# Patient Record
Sex: Male | Born: 1975 | Race: White | Hispanic: No | Marital: Single | Smoking: Current every day smoker
Health system: Southern US, Community
[De-identification: ages and names within clinical notes are randomized; demographics above are authoritative.]

## PROBLEM LIST (undated history)

## (undated) DIAGNOSIS — H9190 Unspecified hearing loss, unspecified ear: Secondary | ICD-10-CM

## (undated) DIAGNOSIS — I82409 Acute embolism and thrombosis of unspecified deep veins of unspecified lower extremity: Secondary | ICD-10-CM

## (undated) DIAGNOSIS — I10 Essential (primary) hypertension: Secondary | ICD-10-CM

## (undated) DIAGNOSIS — Z720 Tobacco use: Secondary | ICD-10-CM

## (undated) HISTORY — PX: CHOLECYSTECTOMY: SHX55

## (undated) HISTORY — DX: Essential (primary) hypertension: I10

---

## 2000-11-16 ENCOUNTER — Encounter: Payer: Self-pay | Admitting: Emergency Medicine

## 2000-11-16 ENCOUNTER — Inpatient Hospital Stay (HOSPITAL_COMMUNITY): Admission: EM | Admit: 2000-11-16 | Discharge: 2000-11-18 | Payer: Self-pay | Admitting: Emergency Medicine

## 2000-11-17 ENCOUNTER — Encounter: Payer: Self-pay | Admitting: General Surgery

## 2009-01-31 ENCOUNTER — Ambulatory Visit: Payer: Self-pay | Admitting: Family Medicine

## 2011-05-28 ENCOUNTER — Ambulatory Visit: Payer: Self-pay

## 2012-02-01 ENCOUNTER — Ambulatory Visit: Payer: Self-pay

## 2012-02-01 LAB — RAPID INFLUENZA A&B ANTIGENS

## 2013-11-21 ENCOUNTER — Ambulatory Visit: Payer: Self-pay | Admitting: Family Medicine

## 2018-12-09 ENCOUNTER — Encounter: Payer: Self-pay | Admitting: Emergency Medicine

## 2018-12-09 ENCOUNTER — Other Ambulatory Visit: Payer: Self-pay

## 2018-12-09 ENCOUNTER — Ambulatory Visit
Admission: EM | Admit: 2018-12-09 | Discharge: 2018-12-09 | Disposition: A | Discharge status: Home / Self Care | Payer: 59 | Attending: Emergency Medicine | Admitting: Emergency Medicine

## 2018-12-09 DIAGNOSIS — S32020D Wedge compression fracture of second lumbar vertebra, subsequent encounter for fracture with routine healing: Secondary | ICD-10-CM | POA: Diagnosis not present

## 2018-12-09 DIAGNOSIS — R030 Elevated blood-pressure reading, without diagnosis of hypertension: Secondary | ICD-10-CM
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
## 2018-12-09 NOTE — Discharge Instructions (Signed)
Decrease your salt intake. diet and exercise will lower your blood pressure significantly. It is important to keep your blood pressure under good control, as having a elevated blood pressure for prolonged periods of time significantly increases your risk of stroke, heart attacks, kidney damage, eye damage, and other problems. Measure your blood pressure once a day, preferably at the same time every day. Keep a log of this and bring it to your next doctor's appointment.  Bring your blood pressure cuff as well.  Return immediately to the ER if you start having chest pain, headache, problems seeing, problems talking, problems walking, if you feel like you're about to pass out, if you do pass out, if you have a seizure, or for any other concerns.  Here is a list of primary care providers who are taking new patients:  Dr. Otilio Miu, Dr. Adline Potter [address] [place] [address] [place] [address]  [phone]  [facility] [address] [place], [place] [address] [phone]  [facility] [place]  [phone] [place], [place] [address]  Here are clinics/ other resources who will see you if you do not have insurance. Some have certain criteria that you must meet. Call them and find out what they are:  [facility]: [address], [place], [place] [address] Phone: [phone] Hours: First and Third Saturdays of each Month, 9 a.m. - 1 p.m.  [facility]: [address] [place], [place], [place] [address] Phone: [phone] Hours: Tuesday, 4 p.m. - 8 p.m. Thursday, 1 p.m. - 8 p.m. Wednesday, 9 a.m. - [facility] [address] [place], [place] [address] Phone: [phone] Pharmacy Phone Number: [phone] Dental Phone Number: [phone] [facility] Help: [phone]  Dental Hours: Monday - Thursday, 8 a.m. - 6 p.m.  [facility] [address], [place], [place] [address] Phone: [phone] Pharmacy Phone Number: [phone] [facility] Insurance Help: [phone]  [facility] [place] [address] [place], [place] [address] Phone: [phone] Pharmacy Phone Number: [phone] [facility] Insurance Help: [phone]  [facility] [address] [place],  [address] Phone: [phone] [facility] Insurance Help: [phone]   [address] [place],  [address] Phone: [phone]  Go to www.goodrx.com to look up your medications. This will give you a list of where you can find your prescriptions at the most affordable prices. Or ask the pharmacist what the cash price is, or if they have any other discount programs available to help make your medication more affordable. This can be less expensive than what you would pay with insurance.

## 2020-08-27 ENCOUNTER — Encounter: Payer: Self-pay | Admitting: Emergency Medicine

## 2020-08-27 ENCOUNTER — Other Ambulatory Visit: Payer: Self-pay

## 2020-08-27 ENCOUNTER — Ambulatory Visit
Admission: EM | Admit: 2020-08-27 | Discharge: 2020-08-27 | Disposition: A | Discharge status: Home / Self Care | Payer: 59 | Attending: Physician Assistant | Admitting: Physician Assistant

## 2020-08-27 DIAGNOSIS — R030 Elevated blood-pressure reading, without diagnosis of hypertension: Secondary | ICD-10-CM | POA: Diagnosis present

## 2020-08-27 DIAGNOSIS — U071 COVID-19: Secondary | ICD-10-CM | POA: Diagnosis not present

## 2020-08-27 DIAGNOSIS — B349 Viral infection, unspecified: Secondary | ICD-10-CM

## 2020-08-27 DIAGNOSIS — R059 Cough, unspecified: Secondary | ICD-10-CM | POA: Diagnosis present

## 2020-08-27 DIAGNOSIS — M791 Myalgia, unspecified site: Secondary | ICD-10-CM | POA: Diagnosis present

## 2020-08-27 MED ORDER — IBUPROFEN 800 MG PO TABS: 800.0000 mg | ORAL_TABLET | Freq: Three times a day (TID) | ORAL | 0 refills | Status: DC

## 2020-08-27 MED ORDER — PSEUDOEPH-BROMPHEN-DM 30-2-10 MG/5ML PO SYRP: 10.0000 mL | ORAL_SOLUTION | Freq: Four times a day (QID) | ORAL | 0 refills | Status: AC | PRN

## 2020-08-27 NOTE — Discharge Instructions (Addendum)

## 2020-08-27 NOTE — ED Provider Notes (Signed)
[facility]    CSN: [id number] Arrival date & time: 08/27/20  1312      History   Chief Complaint Chief Complaint  Patient presents with   Nasal Congestion   Headache   Cough    HPI Casey Kemp is a 45 y.o. male presenting for 1-1/2-day history of fatigue, body aches, headaches, nasal congestion and cough.  He denies any chest pain or breathing difficulty.  No sore throat.  Denies nausea or vomiting but has had some mild diarrhea.  Patient concerned for possible COVID-19.  He is vaccinated for COVID-19 x2.  Denies any known exposure.  Has not been taking any OTC meds for his symptoms.  He is otherwise healthy and denies any chronic medical problems.  He is an everyday smoker.  He has no other complaints.  HPI  History reviewed. No pertinent past medical history.  There are no problems to display for this patient.   Past Surgical History:  Procedure Laterality Date   CHOLECYSTECTOMY         Home Medications    Prior to Admission medications   Medication Sig Start Date End Date Taking? Authorizing Provider  brompheniramine-pseudoephedrine-DM 30-2-10 MG/5ML syrup Take 10 mLs by mouth 4 (four) times daily as needed for up to 7 days. 08/27/20 09/03/20 Yes Shirlee LatchEaves, Gift Rueckert B, PA-C  ibuprofen (ADVIL) 800 MG tablet Take 1 tablet (800 mg total) by mouth 3 (three) times daily. 08/27/20  Yes Shirlee LatchEaves, Anayah Arvanitis B, PA-C    Family History Family History  Problem Relation Age of Onset   Healthy Mother    Healthy Father     Social History Social History   Tobacco Use   Smoking status: Every Day    Types: Cigarettes   Smokeless tobacco: Never  Vaping Use   Vaping Use: Never used  Substance Use Topics   Alcohol use: Yes   Drug use: Never     Allergies   Patient has no known allergies.   Review of Systems Review of Systems  Constitutional:  Positive for fatigue. Negative for fever.  HENT:  Positive for congestion. Negative for rhinorrhea, sinus pressure,  sinus pain and sore throat.   Respiratory:  Positive for cough. Negative for shortness of breath.   Cardiovascular:  Negative for chest pain.  Gastrointestinal:  Positive for diarrhea. Negative for abdominal pain, nausea and vomiting.  Musculoskeletal:  Positive for myalgias.  Neurological:  Positive for headaches. Negative for weakness and light-headedness.  Hematological:  Negative for adenopathy.    Physical Exam Triage Vital Signs ED Triage Vitals  Enc Vitals Group     BP 08/27/20 1417 (!) 160/121     Pulse Rate 08/27/20 1417 (!) 117     Resp 08/27/20 1417 18     Temp 08/27/20 1417 99.6 F (37.6 C)     Temp Source 08/27/20 1417 Oral     SpO2 08/27/20 1417 96 %     Weight --      Height --      Head Circumference --      Peak Flow --      Pain Score 08/27/20 1416 4     Pain Loc --      Pain Edu? --      Excl. in GC? --    No data found.  Updated Vital Signs BP (!) 160/121 (BP Location: Left Arm)   Pulse (!) 117   Temp 99.6 F (37.6 C) (Oral)   Resp 18  SpO2 96%      Physical Exam Vitals and nursing note reviewed.  Constitutional:      General: He is not in acute distress.    Appearance: Normal appearance. He is well-developed. He is not ill-appearing or diaphoretic.  HENT:     Head: Normocephalic and atraumatic.     Nose: Congestion present.     Mouth/Throat:     Mouth: Mucous membranes are moist.     Pharynx: Oropharynx is clear. Uvula midline. Posterior oropharyngeal erythema present.     Tonsils: No tonsillar abscesses.  Eyes:     General: No scleral icterus.       Right eye: No discharge.        Left eye: No discharge.     Conjunctiva/sclera: Conjunctivae normal.  Neck:     Thyroid: No thyromegaly.     Trachea: No tracheal deviation.  Cardiovascular:     Rate and Rhythm: Regular rhythm. Tachycardia present.     Heart sounds: Normal heart sounds.  Pulmonary:     Effort: Pulmonary effort is normal. No respiratory distress.     Breath sounds:  Normal breath sounds. No wheezing, rhonchi or rales.  Musculoskeletal:     Cervical back: Normal range of motion and neck supple.  Lymphadenopathy:     Cervical: No cervical adenopathy.  Skin:    General: Skin is warm and dry.     Findings: No rash.  Neurological:     General: No focal deficit present.     Mental Status: He is alert. Mental status is at baseline.     Motor: No weakness.     Gait: Gait normal.  Psychiatric:        Mood and Affect: Mood is anxious.        Thought Content: Thought content normal.     UC Treatments / Results  Labs (all labs ordered are listed, but only abnormal results are displayed) Labs Reviewed  SARS CORONAVIRUS 2 (TAT 6-24 HRS)    EKG   Radiology No results found.  Procedures Procedures (including critical care time)  Medications Ordered in UC Medications - No data to display  Initial Impression / Assessment and Plan / UC Course  I have reviewed the triage vital signs and the nursing notes.  Pertinent labs & imaging results that were available during my care of the patient were reviewed by me and considered in my medical decision making (see chart for details).  45 year old male presenting for onset of fatigue, body aches, headaches, cough and congestion as well as diarrhea yesterday.  Blood pressure is elevated at 160/121.  No history of hypertension but he has had elevated blood pressure readings before.  Pulse elevated at 117.  Patient says that he is anxious being in the doctor's office and also thinking about the possibility he could have COVID-19.  Exam is significant for minor congestion and mild posterior pharyngeal erythema.  Chest is clear to auscultation.  COVID test obtained.  Current CDC guidelines, isolation protocol and ED precautions reviewed with patient.  If patient test positive for COVID-19 he would be qualified for antiviral therapy since he is a smoker.  At this time encouraged increasing rest and fluids.   Advised him this is likely a viral illness, COVID-19 or other viral illness.  Sent in Bromfed-DM and 800 mg ibuprofen tablets.  Advised to follow-up with [place] as needed.  Work note given.  Final Clinical Impressions(s) / UC Diagnoses   Final diagnoses:  Viral illness  Cough  Myalgia  Elevated blood pressure reading     Discharge Instructions      URI/COLD SYMPTOMS: Your exam today is consistent with a viral illness. Antibiotics are not indicated at this time. Use medications as directed, including cough syrup, nasal saline, and decongestants. Your symptoms should improve over the next few days and resolve within 7-10 days. Increase rest and fluids. F/u if symptoms worsen or predominate such as sore throat, ear pain, productive cough, shortness of breath, or if you develop high fevers or worsening fatigue over the next several days.    You have received COVID testing today either for positive exposure, concerning symptoms that could be related to COVID infection, screening purposes, or re-testing after confirmed positive.  Your test obtained today checks for active viral infection in the last 1-2 weeks. If your test is negative now, you can still test positive later. So, if you do develop symptoms you should either get re-tested and/or isolate x 5 days and then strict mask use x 5 days (unvaccinated) or mask use x 10 days (vaccinated). Please follow CDC guidelines.  While Rapid antigen tests come back in 15-20 minutes, send out PCR/molecular test results typically come back within 1-3 days. In the mean time, if you are symptomatic, assume this could be a positive test and treat/monitor yourself as if you do have COVID.   We will call with test results if positive. Please download the MyChart app and set up a profile to access test results.   If symptomatic, go home and rest. Push fluids. Take Tylenol as needed for discomfort. Gargle warm salt water. Throat lozenges. Take Mucinex DM or Robitussin  for cough. Humidifier in bedroom to ease coughing. Warm showers. Also review the COVID handout for more information.  COVID-19 INFECTION: The incubation period of COVID-19 is approximately 14 days after exposure, with most symptoms developing in roughly 4-5 days. Symptoms may range in severity from mild to critically severe. Roughly 80% of those infected will have mild symptoms. People of any age may become infected with COVID-19 and have the ability to transmit the virus. The most common symptoms include: fever, fatigue, cough, body aches, headaches, sore throat, nasal congestion, shortness of breath, nausea, vomiting, diarrhea, changes in smell and/or taste.    COURSE OF ILLNESS Some patients may begin with mild disease which can progress quickly into critical symptoms. If your symptoms are worsening please call ahead to the Emergency Department and proceed there for further treatment. Recovery time appears to be roughly 1-2 weeks for mild symptoms and 3-6 weeks for severe disease.   GO IMMEDIATELY TO ER FOR FEVER YOU ARE UNABLE TO GET DOWN WITH TYLENOL, BREATHING PROBLEMS, CHEST PAIN, FATIGUE, LETHARGY, INABILITY TO EAT OR DRINK, ETC  QUARANTINE AND ISOLATION: To help decrease the spread of COVID-19 please remain isolated if you have COVID infection or are highly suspected to have COVID infection. This means -stay home and isolate to one room in the home if you live with others. Do not share a bed or bathroom with others while ill, sanitize and wipe down all countertops and keep common areas clean and disinfected. Stay home for 5 days. If you have no symptoms or your symptoms are resolving after 5 days, you can leave your house. Continue to wear a mask around others for 5 additional days. If you have been in close contact (within 6 feet) of someone diagnosed with COVID 19, you are advised to quarantine in your home for 14 days  as symptoms can develop anywhere from 2-14 days after exposure to the virus.  If you develop symptoms, you  must isolate.  Most current guidelines for COVID after exposure -unvaccinated: isolate 5 days and strict mask use x 5 days. Test on day 5 is possible -vaccinated: wear mask x 10 days if symptoms do not develop -You do not necessarily need to be tested for COVID if you have + exposure and  develop symptoms. Just isolate at home x10 days from symptom onset During this global pandemic, CDC advises to practice social distancing, try to stay at least 67ft away from others at all times. Wear a face covering. Wash and sanitize your hands regularly and avoid going anywhere that is not necessary.  KEEP IN MIND THAT THE COVID TEST IS NOT 100% ACCURATE AND YOU SHOULD STILL DO EVERYTHING TO PREVENT POTENTIAL SPREAD OF VIRUS TO OTHERS (WEAR MASK, WEAR GLOVES, WASH HANDS AND SANITIZE REGULARLY). IF INITIAL TEST IS NEGATIVE, THIS MAY NOT MEAN YOU ARE DEFINITELY NEGATIVE. MOST ACCURATE TESTING IS DONE 5-7 DAYS AFTER EXPOSURE.   It is not advised by CDC to get re-tested after receiving a positive COVID test since you can still test positive for weeks to months after you have already cleared the virus.   *If you have not been vaccinated for COVID, I strongly suggest you consider getting vaccinated as long as there are no contraindications.       ED Prescriptions     Medication Sig Dispense Auth. Provider   brompheniramine-pseudoephedrine-DM 30-2-10 MG/5ML syrup Take 10 mLs by mouth 4 (four) times daily as needed for up to 7 days. 150 mL Eusebio Friendly B, PA-C   ibuprofen (ADVIL) 800 MG tablet Take 1 tablet (800 mg total) by mouth 3 (three) times daily. 21 tablet Shirlee Latch, PA-C      PDMP not reviewed this encounter.   Shirlee Latch, PA-C 08/27/20 1559

## 2020-08-27 NOTE — ED Triage Notes (Signed)
Pt presents today with "symptoms of Covid". He c/o headache, body aches, nasal congestion and cough x 2 days.

## 2020-08-28 ENCOUNTER — Telehealth (HOSPITAL_COMMUNITY): Payer: Self-pay | Admitting: Emergency Medicine

## 2020-08-28 LAB — SARS CORONAVIRUS 2 (TAT 6-24 HRS): SARS Coronavirus 2: POSITIVE — AB

## 2020-08-28 MED ORDER — MOLNUPIRAVIR EUA 200MG CAPSULE: 4.0000 | ORAL_CAPSULE | Freq: Two times a day (BID) | ORAL | 0 refills | Status: AC

## 2020-08-28 NOTE — Telephone Encounter (Signed)
Casey Kemp, APP asked me to send antiviral for COVID positive patient.  Attempted to reach patient to review, VM is not setup

## 2020-09-23 ENCOUNTER — Other Ambulatory Visit: Payer: Self-pay

## 2020-09-23 ENCOUNTER — Encounter: Payer: Self-pay | Admitting: Emergency Medicine

## 2020-09-23 ENCOUNTER — Ambulatory Visit
Admission: EM | Admit: 2020-09-23 | Discharge: 2020-09-23 | Disposition: A | Discharge status: Home / Self Care | Payer: 59 | Attending: Physician Assistant | Admitting: Physician Assistant

## 2020-09-23 ENCOUNTER — Ambulatory Visit (INDEPENDENT_AMBULATORY_CARE_PROVIDER_SITE_OTHER): Payer: 59

## 2020-09-23 DIAGNOSIS — M79604 Pain in right leg: Secondary | ICD-10-CM

## 2020-09-23 DIAGNOSIS — I82431 Acute embolism and thrombosis of right popliteal vein: Secondary | ICD-10-CM | POA: Diagnosis not present

## 2020-09-23 MED ORDER — APIXABAN (ELIQUIS) VTE STARTER PACK (10MG AND 5MG): ORAL_TABLET | ORAL | 0 refills | Status: DC

## 2020-09-23 NOTE — ED Triage Notes (Signed)
Pt presents today with pain to right lower leg x 1.5 wks. He denies specific injury but believes it might be from painting and climbing a latter at home. +ambulatory

## 2020-09-23 NOTE — ED Provider Notes (Signed)
[facility]    CSN: [id number] Arrival date & time: 09/23/20  8295      History   Chief Complaint Chief Complaint  Patient presents with   Leg Pain     Right lower    HPI Casey Kemp is a 45 y.o. male presenting for right lower leg/calf pain x1.5 weeks.  No specific injury or trauma.  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  Patient also has history of lichen planus and has a breakout of both lower extremities at this time.  Patient denies any open wounds.  Has not had any fevers or fatigue.  Patient does state that the pain radiates from his ankle all the way up to the back of his knee.  He does have little bit increased pain with ambulation and climbing.  He says he has not taken anything for pain.  He does have history of COVID-19 infection about 3 weeks ago.  Patient is very active and denies sedentary lifestyle.  No recent travel or surgeries.  Denies any history of DVT or PE.  No other complaints.  HPI  History reviewed. No pertinent past medical history.  There are no problems to display for this patient.   Past Surgical History:  Procedure Laterality Date   CHOLECYSTECTOMY         Home Medications    Prior to Admission medications   Medication Sig Start Date End Date Taking? Authorizing Provider  APIXABAN (ELIQUIS) VTE STARTER PACK (10MG  AND 5MG ) Take as directed on package: start with two-5mg  tablets twice daily for 7 days. On day 8, switch to one-5mg  tablet twice daily. 09/23/20  Yes , PA-C    Family History Family History  Problem Relation Age of Onset   Healthy Mother    Healthy Father     Social History Social History   Tobacco Use   Smoking status: Every Day    Types: Cigarettes   Smokeless tobacco: Never  Vaping Use   Vaping Use: Never used  Substance Use  Topics   Alcohol use: Yes   Drug use: Never     Allergies   Patient has no known allergies.   Review of Systems Review of Systems  Constitutional:  Negative for fatigue and fever.  Respiratory:  Negative for shortness of breath.   Cardiovascular:  Negative for chest pain and palpitations.  Musculoskeletal:  Positive for arthralgias, joint swelling and myalgias. Negative for gait problem.  Skin:  Negative for color change.  Neurological:  Negative for weakness and numbness.    Physical Exam Triage Vital Signs ED Triage Vitals  Enc Vitals Group     BP 09/23/20 1014 (!) 179/134     Pulse Rate 09/23/20 1014 (!) 106     Resp 09/23/20 1014 18     Temp 09/23/20 1014 98.4 F (36.9 C)     Temp Source 09/23/20 1014 Oral     SpO2 09/23/20 1014 100 %     Weight --      Height --      Head Circumference --      Peak Flow --      Pain Score 09/23/20 1012 7     Pain Loc --      Pain Edu? --      Excl. in GC? --  No data found.  Updated Vital Signs BP (!) 172/130   Pulse (!) 106   Temp 98.4 F (36.9 C) (Oral)   Resp 18   SpO2 100%       Physical Exam Vitals and nursing note reviewed.  Constitutional:      General: He is not in acute distress.    Appearance: Normal appearance. He is well-developed. He is not ill-appearing.  HENT:     Head: Normocephalic and atraumatic.  Eyes:     General: No scleral icterus.    Conjunctiva/sclera: Conjunctivae normal.  Cardiovascular:     Rate and Rhythm: Regular rhythm. Tachycardia present.     Heart sounds: Normal heart sounds.  Pulmonary:     Effort: Pulmonary effort is normal. No respiratory distress.     Breath sounds: Normal breath sounds.  Musculoskeletal:     Cervical back: Neck supple.     Comments: RIGHT LOWER EXT: Measuring 4 inches distally from the tibial tuberosity, the calf is 14.5 inches in circumference.  It is 14 inches in circumference on the left.  Additionally he does have some swelling of the medial ankle.   Tenderness to palpation of the medial ankle and diffusely throughout the calf.  No tenderness to palpation of the knee joint and full range of motion of the knee.  He does have increased pain with dorsi flexion and plantar flexion.  Good strength and sensation.  Additionally, of note he has numerous areas of dry purplish macules and papules of bilateral lower extremities w/o surrounding erythema or warmth. No signs of soft tissue infection.  Skin:    General: Skin is warm and dry.  Neurological:     General: No focal deficit present.     Mental Status: He is alert. Mental status is at baseline.     Motor: No weakness.     Gait: Gait normal.  Psychiatric:        Mood and Affect: Mood normal.        Thought Content: Thought content normal.     UC Treatments / Results  Labs (all labs ordered are listed, but only abnormal results are displayed) Labs Reviewed - No data to display  EKG   Radiology US Venous Img Lower Unilateral Right  Result Date: 09/23/2020 CLINICAL DATA:  Right lower extremity swelling and pain. EXAM: RIGHT LOWER EXTREMITY VENOUS DOPPLER ULTRASOUND TECHNIQUE: Gray-scale sonography with compression, as well as color and duplex ultrasound, were performed to evaluate the deep venous system(s) from the level of the common femoral vein through the popliteal and proximal calf veins. COMPARISON:  None. FINDINGS: VENOUS Normal compressibility of the common femoral and superficial femoral veins. Visualized portions of profunda femoral vein and great saphenous vein unremarkable. The right popliteal vein is distended and occluded by echogenic thrombus. Occlusive thrombus is also seen within the peroneal and posterior tibial veins. OTHER None. Limitations: none IMPRESSION: Positive for occlusive DVT in the right popliteal vein, peroneal, and posterior tibial veins. Electronically Signed   By: Danae Orleans M.D.   On: 09/23/2020 11:51    Procedures Procedures (including critical care  time)  Medications Ordered in UC Medications - No data to display  Initial Impression / Assessment and Plan / UC Course  I have reviewed the triage vital signs and the nursing notes.  Pertinent labs & imaging results that were available during my care of the patient were reviewed by me and considered in my medical decision making (see chart for details).  45 year old  male presenting for right ankle and calf pain x1.5 weeks.  No specific injury but it did occur after he was ascending and descending ladders for several hours while painting.  Additionally of note he does have history of COVID-19 infection about 3 weeks ago putting him at risk for increased clotting/DVT.  No history of DVT or PE and no other risk factors.  BP elevated 172/130 and pulse elevated at 106.  He was seen at the beginning of August by me and blood pressure about the same at that time.  Ultrasound DVT study of right lower extremity ordered to assess for possible DVT.  Ultrasound positive for occlusive DVT of popliteal, peroneal, and posterior tibial veins.  Reviewed results with patient.  I did call and discussed the case with Dr. Leonides Grills.  Patient should be safe to be treated outpatient with strict ER precautions which I have reviewed with him.  Patient started on Eliquis starter pack.  Since he does not have a PCP, we contacted [facility] and patient has an appointment in 3 days with Dr. Yetta Barre.  Advised to stop NSAIDs and take Tylenol as needed for pain.  Again, I reviewed ED precautions with patient.   Final Clinical Impressions(s) / UC Diagnoses   Final diagnoses:  Acute deep vein thrombosis (DVT) of popliteal vein of right lower extremity (HCC)     Discharge Instructions      You have a very large blood clot in her leg from the knee all the way down.  I have sent a medication called an anticoagulant to the pharmacy for you.  Take as directed discussed this may need to be continued for another couple of  months.  Since you do not have a primary care provider we have set you up an appointment with [facility] in the building upstairs.  Your appointment is this Thursday at 3:20 PM with Dr. Yetta Barre.  If you experience any chest pain, chest pain on breathing, shortness of breath, weakness, dizziness, severe headaches you need to call 911 or have some take it immediately to the emergency department.  As we discussed DVTs can cause clots that lead to your lungs which can be life-threatening.  Do not take any NSAIDs.  You can take Tylenol for pain.  [facility] at [facility], [address], [address]. Phone number: [phone]      ED Prescriptions     Medication Sig Dispense Auth. Provider   APIXABAN (ELIQUIS) VTE STARTER PACK (10MG  AND 5MG ) Take as directed on package: start with two-5mg  tablets twice daily for 7 days. On day 8, switch to one-5mg  tablet twice daily. 1 each      PDMP not reviewed this encounter.   , PA-C 09/23/20 1259

## 2020-09-23 NOTE — Discharge Instructions (Addendum)
You have a very large blood clot in her leg from the knee all the way down.  I have sent a medication called an anticoagulant to the pharmacy for you.  Take as directed discussed this may need to be continued for another couple of months.  Since you do not have a primary care provider we have set you up an appointment with [facility] in the building upstairs.  Your appointment is this Thursday at 3:20 PM with Dr. Yetta Barre.  If you experience any chest pain, chest pain on breathing, shortness of breath, weakness, dizziness, severe headaches you need to call 911 or have some take it immediately to the emergency department.  As we discussed DVTs can cause clots that lead to your lungs which can be life-threatening.  Do not take any NSAIDs.  You can take Tylenol for pain.  [facility] at [facility], [address], [address]. Phone number: [phone]

## 2020-09-26 ENCOUNTER — Ambulatory Visit (INDEPENDENT_AMBULATORY_CARE_PROVIDER_SITE_OTHER): Payer: 59 | Admitting: Family Medicine

## 2020-09-26 ENCOUNTER — Other Ambulatory Visit: Payer: Self-pay

## 2020-09-26 ENCOUNTER — Other Ambulatory Visit: Payer: Self-pay | Admitting: Family Medicine

## 2020-09-26 ENCOUNTER — Encounter: Payer: Self-pay | Admitting: Family Medicine

## 2020-09-26 VITALS — BP 160/120 | HR 104 | Ht 74.0 in | Wt 203.0 lb

## 2020-09-26 DIAGNOSIS — F17210 Nicotine dependence, cigarettes, uncomplicated: Secondary | ICD-10-CM | POA: Diagnosis not present

## 2020-09-26 DIAGNOSIS — I10 Essential (primary) hypertension: Secondary | ICD-10-CM | POA: Diagnosis not present

## 2020-09-26 DIAGNOSIS — I82451 Acute embolism and thrombosis of right peroneal vein: Secondary | ICD-10-CM | POA: Diagnosis not present

## 2020-09-26 LAB — BASIC METABOLIC PANEL
Creatinine: 0.9 (ref 0.6–1.3)
Glucose: 95

## 2020-09-26 LAB — COMPREHENSIVE METABOLIC PANEL
Albumin: 4.8 (ref 3.5–5.0)
GFR calc non Af Amer: 105

## 2020-09-26 LAB — CBC AND DIFFERENTIAL
HCT: 60 — AB (ref 41–53)
Platelets: 319 (ref 150–399)

## 2020-09-26 MED ORDER — LISINOPRIL 5 MG PO TABS: 5.0000 mg | ORAL_TABLET | Freq: Every day | ORAL | 2 refills | Status: DC

## 2020-09-26 MED ORDER — LISINOPRIL 10 MG PO TABS: 10.0000 mg | ORAL_TABLET | Freq: Every day | ORAL | 2 refills | Status: DC

## 2020-09-26 NOTE — Progress Notes (Addendum)
Date:  09/26/2020   Name:  Casey Kemp   DOB:  January 09, 1976   MRN:  [id number]   Chief Complaint: Establish Care (Had a DVT after COVID- was started on Eliquis)  Patient is a 45 year old male who presents for a establish care exam. The patient reports the following problems: DVT. Health maintenance has been reviewed up to date.     No results found for: CREATININE, BUN, NA, K, CL, CO2 No results found for: CHOL, HDL, LDLCALC, LDLDIRECT, TRIG, CHOLHDL No results found for: TSH No results found for: HGBA1C No results found for: WBC, HGB, HCT, MCV, PLT No results found for: ALT, AST, GGT, ALKPHOS, BILITOT   Review of Systems  Constitutional:  Negative for chills and fever.  HENT:  Negative for drooling, ear discharge, ear pain and sore throat.   Respiratory:  Negative for cough, shortness of breath and wheezing.   Cardiovascular:  Negative for chest pain, palpitations and leg swelling.  Gastrointestinal:  Negative for abdominal pain, blood in stool, constipation, diarrhea and nausea.  Endocrine: Negative for polydipsia.  Genitourinary:  Negative for dysuria, frequency, hematuria and urgency.  Musculoskeletal:  Negative for back pain, myalgias and neck pain.  Skin:  Negative for rash.  Allergic/Immunologic: Negative for environmental allergies.  Neurological:  Negative for dizziness and headaches.  Hematological:  Negative for adenopathy. Does not bruise/bleed easily.  Psychiatric/Behavioral:  Negative for suicidal ideas. The patient is not nervous/anxious.    There are no problems to display for this patient.   No Known Allergies  Past Surgical History:  Procedure Laterality Date   CHOLECYSTECTOMY      Social History   Tobacco Use   Smoking status: Every Day    Packs/day: 0.50    Years: 18.00    Pack years: 9.00    Types: Cigarettes   Smokeless tobacco: Never  Vaping Use   Vaping Use: Never used  Substance Use Topics   Alcohol use: Yes    Comment:  weekends/ beer   Drug use: Yes    Types: Marijuana     Medication list has been reviewed and updated.  Current Meds  Medication Sig   APIXABAN (ELIQUIS) VTE STARTER PACK (10MG  AND 5MG ) Take as directed on package: start with two-5mg  tablets twice daily for 7 days. On day 8, switch to one-5mg  tablet twice daily.    PHQ 2/9 Scores 09/26/2020  PHQ - 2 Score 0  PHQ- 9 Score 0    GAD 7 : Generalized Anxiety Score 09/26/2020  Nervous, Anxious, on Edge 0  Control/stop worrying 0  Worry too much - different things 0  Trouble relaxing 0  Restless 0  Easily annoyed or irritable 0  Afraid - awful might happen 0  Total GAD 7 Score 0    BP Readings from Last 3 Encounters:  09/26/20 (!) 160/120  09/23/20 (!) 172/130  08/27/20 (!) 160/121    Physical Exam Vitals and nursing note reviewed.  HENT:     Head: Normocephalic.     Right Ear: Tympanic membrane and external ear normal.     Left Ear: Tympanic membrane and external ear normal.     Nose: Nose normal. No congestion or rhinorrhea.  Eyes:     General: No scleral icterus.       Right eye: No discharge.        Left eye: No discharge.     Conjunctiva/sclera: Conjunctivae normal.     Pupils: Pupils are equal, round,  and reactive to light.  Neck:     Thyroid: No thyromegaly.     Vascular: No carotid bruit or JVD.     Trachea: No tracheal deviation.  Cardiovascular:     Rate and Rhythm: Normal rate and regular rhythm.     Heart sounds: Normal heart sounds. No murmur heard.   No friction rub. No gallop.  Pulmonary:     Effort: No respiratory distress.     Breath sounds: Normal breath sounds. No stridor. No wheezing, rhonchi or rales.  Abdominal:     General: Bowel sounds are normal.     Palpations: Abdomen is soft. There is no mass.     Tenderness: There is no abdominal tenderness. There is no guarding or rebound.  Musculoskeletal:        General: No tenderness. Normal range of motion.     Cervical back: Normal range of  motion and neck supple.     Right lower leg: Normal. No tenderness.     Comments: One half inch discrepency r>l  Lymphadenopathy:     Cervical: No cervical adenopathy.  Skin:    General: Skin is warm.     Findings: No bruising or rash.  Neurological:     Mental Status: He is alert and oriented to person, place, and time.     Cranial Nerves: No cranial nerve deficit.     Deep Tendon Reflexes: Reflexes are normal and symmetric.    Wt Readings from Last 3 Encounters:  09/26/20 203 lb (92.1 kg)  12/09/18 200 lb (90.7 kg)    BP (!) 160/120   Pulse (!) 104   Ht 6\' 2"  (1.88 m)   Wt 203 lb (92.1 kg)   BMI 26.06 kg/m   Assessment and Plan:  1. Acute deep vein thrombosis (DVT) of right peroneal vein (HCC) New onset.  Persistent.  Patient was diagnosed 09/23/2020 with peroneal DVT which also involve the posterior tibial veins.  Currently he is on Eliquis 10 mg 2 twice a day which will after week be decreased to 10 mg 1 twice a day.  Patient's been told that he is can need to be on this likely for 6 months.  We will make an appointment with vein and vascular for evaluation in the meantime we are going to obtain a CBC and a renal panel. - CBC with Differential/Platelet - Ambulatory referral to Vascular Surgery  2. Cigarette nicotine dependence without complication Patient has been advised of the health risks of smoking and counseled concerning cessation of tobacco products. I spent over 3 minutes for discussion and to answer questions.   3. Primary hypertension Chronic.  Controlled.  Stable.  Blood pressure today is noted to be 160/120 and has been elevated almost readings in other clinics.  We will initiate treatment with lisinopril 10 mg once a day.  We will recheck blood pressure in 4 weeks - Renal Function Panel

## 2020-09-26 NOTE — Patient Instructions (Addendum)
Managing the Challenge of Quitting Smoking Quitting smoking is a physical and mental challenge. You will face cravings, withdrawal symptoms, and temptation. Before quitting, work with your health care provider to make a plan that can help you manage quitting. Preparation can help you quit and keep you from giving in. How to manage lifestyle changes Managing stress Stress can make you want to smoke, and wanting to smoke may cause stress. It is important to find ways to manage your stress. You might try some of the following: Practice relaxation techniques. Breathe slowly and deeply, in through your nose and out through your mouth. Listen to music. Soak in a bath or take a shower. Imagine a peaceful place or vacation. Get some support. Talk with family or friends about your stress. Join a support group. Talk with a counselor or therapist. Get some physical activity. Go for a walk, run, or bike ride. Play a favorite sport. Practice yoga.  Medicines Talk with your health care provider about medicines that might help you deal with cravings and make quitting easier for you. Relationships Social situations can be difficult when you are quitting smoking. To manage this, you can: Avoid parties and other social situations where people might be smoking. Avoid alcohol. Leave right away if you have the urge to smoke. Explain to your family and friends that you are quitting smoking. Ask for support and let them know you might be a bit grumpy. Plan activities where smoking is not an option. General instructions Be aware that many people gain weight after they quit smoking. However, not everyone does. To keep from gaining weight, have a plan in place before you quit and stick to the plan after you quit. Your plan should include: Having healthy snacks. When you have a craving, it may help to: Eat popcorn, carrots, celery, or other cut vegetables. Chew sugar-free gum. Changing how you eat. Eat small  portion sizes at meals. Eat 4-6 small meals throughout the day instead of 1-2 large meals a day. Be mindful when you eat. Do not watch television or do other things that might distract you as you eat. Exercising regularly. Make time to exercise each day. If you do not have time for a long workout, do short bouts of exercise for 5-10 minutes several times a day. Do some form of strengthening exercise, such as weight lifting. Do some exercise that gets your heart beating and causes you to breathe deeply, such as walking fast, running, swimming, or biking. This is very important. Drinking plenty of water or other low-calorie or no-calorie drinks. Drink 6-8 glasses of water daily.  How to recognize withdrawal symptoms Your body and mind may experience discomfort as you try to get used to not having nicotine in your system. These effects are called withdrawal symptoms. They may include: Feeling hungrier than normal. Having trouble concentrating. Feeling irritable or restless. Having trouble sleeping. Feeling depressed. Craving a cigarette. To manage withdrawal symptoms: Avoid places, people, and activities that trigger your cravings. Remember why you want to quit. Get plenty of sleep. Avoid coffee and other caffeinated drinks. These may worsen some of your symptoms. These symptoms may surprise you. But be assured that they are normal to have when quitting smoking. How to manage cravings Come up with a plan for how to deal with your cravings. The plan should include the following: A definition of the specific situation you want to deal with. An alternative action you will take. A clear idea for how this action  will help. The name of someone who might help you with this. Cravings usually last for 5-10 minutes. Consider taking the following actions to help you with your plan to deal with cravings: Keep your mouth busy. Chew sugar-free gum. Suck on hard candies or a straw. Brush your  teeth. Keep your hands and body busy. Change to a different activity right away. Squeeze or play with a ball. Do an activity or a hobby, such as making bead jewelry, practicing needlepoint, or working with wood. Mix up your normal routine. Take a short exercise break. Go for a quick walk or run up and down stairs. Focus on doing something kind or helpful for someone else. Call a friend or family member to talk during a craving. Join a support group. Contact a quitline. Where to find support To get help or find a support group: Call the National Cancer Institute's Smoking Quitline: 1-800-QUIT NOW 9594655563(863 508 1437) Visit the website of the Substance Abuse and Mental Health Services Administration: SkateOasis.com.ptwww.samhsa.gov Text QUIT to SmokefreeTXT: 454098: 478848 Where to find more information Visit these websites to find more information on quitting smoking: National Cancer Institute: www.smokefree.gov American Lung Association: www.lung.org American Cancer Society: www.cancer.org Centers for Disease Control and Prevention: FootballExhibition.com.brwww.cdc.gov American Heart Association: www.heart.org Contact a health care provider if: You want to change your plan for quitting. The medicines you are taking are not helping. Your eating feels out of control or you cannot sleep. Get help right away if: You feel depressed or become very anxious. Summary Quitting smoking is a physical and mental challenge. You will face cravings, withdrawal symptoms, and temptation to smoke again. Preparation can help you as you go through these challenges. Try different techniques to manage stress, handle social situations, and prevent weight gain. You can deal with cravings by keeping your mouth busy (such as by chewing gum), keeping your hands and body busy, calling family or friends, or contacting a quitline for people who want to quit smoking. You can deal with withdrawal symptoms by avoiding places where people smoke, getting plenty of rest, and  avoiding drinks with caffeine. This information is not intended to replace advice given to you by your health care provider. Make sure you discuss any questions you have with your health care provider. Document Revised: 11/01/2018 Document Reviewed: 11/01/2018 Elsevier Patient Education  2022 [employer] Apixaban Tablets What is this medication? APIXABAN (a PIX a ban) prevents or treats blood clots. It is also used to lower the risk of stroke in people with AFib (atrial fibrillation). It belongs to a group of medications called blood thinners. This medicine may be used for other purposes; ask your health care provider or pharmacist if you have questions. COMMON BRAND NAME(S): Eliquis What should I tell my care team before I take this medication? They need to know if you have any of these conditions: Antiphospholipid antibody syndrome Bleeding disorder History of bleeding in the brain History of blood clots History of stomach bleeding Kidney disease Liver disease Mechanical heart valve Spinal surgery An unusual or allergic reaction to apixaban, other medications, foods, dyes, or preservatives Pregnant or trying to get pregnant Breast-feeding How should I use this medication? Take this medication by mouth. For your therapy to work as well as possible, take each dose exactly as prescribed on the prescription label. Do not skip doses. Skipping doses or stopping this medication can increase your risk of a blood clot or stroke. Keep taking this medication unless your care team tells you to stop. Take  it as directed on the prescription label at the same time every day. You can take it with or without food. If it upsets your stomach, take it with food. A special MedGuide will be given to you by the pharmacist with each prescription and refill. Be sure to read this information carefully each time. Talk to your care team about the use of this medication in children. Special care may be  needed. Overdosage: If you think you have taken too much of this medicine contact a poison control center or emergency room at once. NOTE: This medicine is only for you. Do not share this medicine with others. What if I miss a dose? If you miss a dose, take it as soon as you can. If it is almost time for your next dose, take only that dose. Do not take double or extra doses. What may interact with this medication? This medication may interact with the following: Aspirin and aspirin-like medications Certain medications for fungal infections like itraconazole and ketoconazole Certain medications for seizures like carbamazepine and phenytoin Certain medications for blood clots like enoxaparin, dalteparin, heparin, and warfarin Clarithromycin NSAIDs, medications for pain and inflammation, like ibuprofen or naproxen Rifampin Ritonavir St. John's wort This list may not describe all possible interactions. Give your health care provider a list of all the medicines, herbs, non-prescription drugs, or dietary supplements you use. Also tell them if you smoke, drink alcohol, or use illegal drugs. Some items may interact with your medicine. What should I watch for while using this medication? Visit your healthcare professional for regular checks on your progress. You may need blood work done while you are taking this medication. Your condition will be monitored carefully while you are receiving this medication. It is important not to miss any appointments. Avoid sports and activities that might cause injury while you are using this medication. Severe falls or injuries can cause unseen bleeding. Be careful when using sharp tools or knives. Consider using an Neurosurgeon. Take special care brushing or flossing your teeth. Report any injuries, bruising, or red spots on the skin to your healthcare professional. If you are going to need surgery or other procedure, tell your healthcare professional that you are  taking this medication. Wear a medical ID bracelet or chain. Carry a card that describes your disease and details of your medication and dosage times. What side effects may I notice from receiving this medication? Side effects that you should report to your care team as soon as possible: Allergic reactions-skin rash, itching, hives, swelling of the face, lips, tongue, or throat Bleeding-bloody or black, tar-like stools, vomiting blood or brown material that looks like coffee grounds, red or dark brown urine, small red or purple spots on the skin, unusual bruising or bleeding Bleeding in the brain-severe headache, stiff neck, confusion, dizziness, change in vision, numbness or weakness of the face, arm, or leg, trouble speaking, trouble walking, vomiting Heavy periods This list may not describe all possible side effects. Call your doctor for medical advice about side effects. You may report side effects to FDA at 1-800-FDA-1088. Where should I keep my medication? Keep out of the reach of children and pets. Store at room temperature between 20 and 25 degrees C (68 and 77 degrees F). Get rid of any unused medication after the expiration date. To get rid of medications that are no longer needed or expired: Take the medication to a medication take-back program. Check with your pharmacy or law enforcement to find a  location. If you cannot return the medication, check the label or package insert to see if the medication should be thrown out in the garbage or flushed down the toilet. If you are not sure, ask your care team. If it is safe to put in the trash, empty the medication out of the container. Mix the medication with cat litter, dirt, coffee grounds, or other unwanted substance. Seal the mixture in a bag or container. Put it in the trash. NOTE: This sheet is a summary. It may not cover all possible information. If you have questions about this medicine, talk to your doctor, pharmacist, or health care  provider.  2022 Elsevier/Gold Standard (2020-02-09 12:37:23) Deep Vein Thrombosis Deep vein thrombosis (DVT) is a condition in which a blood clot forms in a deep vein, such as a vein in the lower leg, thigh, pelvis, or arm. Deep veins are veins in the deep venous system. A clot is blood that has thickened into a gel or solid. This condition is serious and can be life-threatening if the clot travels to the lungs and causes a blockage (pulmonary embolism) in the arteries of the lung. A DVT can also damage veins in the leg. This can lead to long-term, or chronic, venous disease, leg pain, swelling, discoloration, and ulcers or sores (post-thrombotic syndrome). What are the causes? This condition may be caused by: A slowdown of blood flow. Damage to a vein. A condition that causes blood to clot more easily, such as certain blood-clotting disorders. What increases the risk? The following factors may make you more likely to develop this condition: Having obesity. Being older, especially older than age 46. Being inactive (sedentary lifestyle) or not moving around. This may include: Sitting or lying down for longer than 4-6 hours other than to sleep at night. Being in the hospital, having major or lengthy surgery, or having a thin, flexible tube (central line catheter) placed in a large vein. Being pregnant, giving birth, or having recently given birth. Taking medicines that contain estrogen, such as birth control or hormone replacement therapy. Using products that contain nicotine or tobacco, especially if you use hormonal birth control. Having a history of blood clots or a blood-clotting disease, a blood vessel disease (peripheral vascular disease), or congestive heart disease. Having a history of cancer, especially if being treated with chemotherapy. What are the signs or symptoms? Symptoms of this condition include: Swelling, pain, pressure, or tenderness in an arm or a leg. An arm or a leg  becoming warm, red, or discolored. A leg turning very pale. You may have a large DVT. This is rare. If the clot is in your leg, you may notice symptoms more or have worse symptoms when you stand or walk. In some cases, there are no symptoms. How is this diagnosed? This condition is diagnosed with: Your medical history and a physical exam. Tests, such as: Blood tests to check how well your blood clots. Doppler ultrasound. This is the best way to find a DVT. Venogram. Contrast dye is injected into a vein, and X-rays are taken to check for clots. How is this treated? Treatment for this condition depends on: The cause of your DVT. The size and location of your DVT, or having more than one DVT. Your risk for bleeding or developing more clots. Other medical conditions you may have. Treatment may include: Taking a blood thinner, also called an anticoagulant, to prevent clots from forming and growing. Wearing compression stockings, if directed. Injecting medicines into the affected  vein to break up the clot (catheter-directed thrombolysis). This is used only for severe DVT and only if a specialist recommends it. Specific surgical procedures, when DVT is severe or hard to treat. These may be done to: Isolate and remove your clot. Place an inferior vena cava (IVC) filter in a large vein to catch blood clots before they reach your lungs. You may get some medical treatments for 6 months or longer. Follow these instructions at home: If you are taking blood thinners: Talk with your health care provider before you take any medicines that contain aspirin or NSAIDs, such as ibuprofen. These medicines increase your risk for dangerous bleeding. Take your medicine exactly as told, at the same time every day. Do not skip a dose. Do not take more than the prescribed dose. This is important. Ask your health care provider about foods and medicines that could change the way your blood thinner works (may  interact). Avoid these foods and medicines if you are told to do so. Avoid anything that may cause bleeding or bruising. You may bleed more easily while taking blood thinners. Be very careful when using knives, scissors, or other sharp objects. Use an electric razor instead of a blade. Avoid activities that could cause injury or bruising, and follow instructions for preventing falls. Tell your health care provider if you have had any internal bleeding, bleeding ulcers, or neurologic diseases, such as strokes or cerebral aneurysms. Wear a medical alert bracelet or carry a card that lists what medicines you take. General instructions Take over-the-counter and prescription medicines only as told by your health care provider. Return to your normal activities as told by your health care provider. Ask your health care provider what activities are safe for you. If recommended, wear compression stockings as told by your health care provider. These stockings help to prevent blood clots and reduce swelling in your legs. Keep all follow-up visits as told by your health care provider. This is important. Contact a health care provider if: You miss a dose of your blood thinner. You have new or worse pain, swelling, or redness in an arm or a leg. You have worsening numbness or tingling in an arm or a leg. You have unusual bruising. Get help right away if: You have signs or symptoms that a blood clot has moved to the lungs. These may include: Shortness of breath. Chest pain. Fast or irregular heartbeats (palpitations). Light-headedness or dizziness. Coughing up blood. You have signs or symptoms that your blood is too thin. These may include: Blood in your vomit, stool, or urine. A cut that will not stop bleeding. A menstrual period that is heavier than usual. A severe headache or confusion. These symptoms may represent a serious problem that is an emergency. Do not wait to see if the symptoms will go  away. Get medical help right away. Call your local emergency services (911 in the [place] Do not drive yourself to the hospital. Summary Deep vein thrombosis (DVT) happens when a blood clot forms in a deep vein. This may occur in the lower leg, thigh, pelvis, or arm. Symptoms affect the arm or leg and can include swelling, pain, tenderness, warmth, redness, or discoloration. This condition may be treated with medicines or compression stockings. In severe cases, surgery may be done. If you are taking blood thinners, take them exactly as told. Do not skip a dose. Do not take more than is prescribed. Get help right away if you have shortness of breath, chest pain,  fast or irregular heartbeats, or blood in your vomit, urine, or stool. This information is not intended to replace advice given to you by your health care provider. Make sure you discuss any questions you have with your health care provider. Document Revised: 01/06/2019 Document Reviewed: 01/07/2019 Elsevier Patient Education  2022 [employer].

## 2020-09-27 ENCOUNTER — Telehealth: Payer: Self-pay

## 2020-09-27 ENCOUNTER — Other Ambulatory Visit: Payer: Self-pay

## 2020-09-27 DIAGNOSIS — D582 Other hemoglobinopathies: Secondary | ICD-10-CM

## 2020-09-27 DIAGNOSIS — F17210 Nicotine dependence, cigarettes, uncomplicated: Secondary | ICD-10-CM

## 2020-09-27 DIAGNOSIS — I82451 Acute embolism and thrombosis of right peroneal vein: Secondary | ICD-10-CM

## 2020-09-27 LAB — RENAL FUNCTION PANEL
Albumin: 4.8 g/dL (ref 4.0–5.0)
BUN/Creatinine Ratio: 5 — ABNORMAL LOW (ref 9–20)
BUN: 5 mg/dL — ABNORMAL LOW (ref 6–24)
CO2: 23 mmol/L (ref 20–29)
Calcium: 10.1 mg/dL (ref 8.7–10.2)
Chloride: 97 mmol/L (ref 96–106)
Creatinine, Ser: 0.91 mg/dL (ref 0.76–1.27)
Glucose: 95 mg/dL (ref 65–99)
Phosphorus: 3.4 mg/dL (ref 2.8–4.1)
Potassium: 4.8 mmol/L (ref 3.5–5.2)
Sodium: 139 mmol/L (ref 134–144)
eGFR: 105 mL/min/{1.73_m2} (ref >59)

## 2020-09-27 LAB — CBC WITH DIFFERENTIAL/PLATELET
Basophils Absolute: 0.1 10*3/uL (ref 0.0–0.2)
Basos: 1 %
EOS (ABSOLUTE): 0.3 10*3/uL (ref 0.0–0.4)
Eos: 2 %
Hematocrit: 60.3 % — ABNORMAL HIGH (ref 37.5–51.0)
Hemoglobin: 21.5 g/dL (ref 13.0–17.7)
Immature Grans (Abs): 0.1 10*3/uL (ref 0.0–0.1)
Immature Granulocytes: 1 %
Lymphocytes Absolute: 3.2 10*3/uL — ABNORMAL HIGH (ref 0.7–3.1)
Lymphs: 27 %
MCH: 38.1 pg — ABNORMAL HIGH (ref 26.6–33.0)
MCHC: 35.7 g/dL (ref 31.5–35.7)
MCV: 107 fL — ABNORMAL HIGH (ref 79–97)
Monocytes Absolute: 1.3 10*3/uL — ABNORMAL HIGH (ref 0.1–0.9)
Monocytes: 11 %
Neutrophils Absolute: 6.9 10*3/uL (ref 1.4–7.0)
Neutrophils: 58 %
Platelets: 319 10*3/uL (ref 150–450)
RBC: 5.64 x10E6/uL (ref 4.14–5.80)
RDW: 13 % (ref 11.6–15.4)
WBC: 11.9 10*3/uL — ABNORMAL HIGH (ref 3.4–10.8)

## 2020-09-27 NOTE — Telephone Encounter (Signed)
Patient needs to schedule an appt with Dr Yetta Barre TODAY asap for abnormal lab results.  Tried calling patient and unable to reach him or leave him a VM.

## 2020-09-27 NOTE — Progress Notes (Signed)
Placed ref to hematology

## 2020-09-27 NOTE — Telephone Encounter (Signed)
Called pt and discussed the need to come in and discuss labs and further testing. Also to discuss the need for further eval with hematology. Pt stated he is out of town and cannot come in. I explained to him his hemoglobin and hemat are elevated. 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 I explained to him we may not be able to add these tests on and he may not hear about referral until next week due to holiday.

## 2020-10-03 ENCOUNTER — Inpatient Hospital Stay: Payer: 59

## 2020-10-03 ENCOUNTER — Encounter: Payer: Self-pay | Admitting: Oncology

## 2020-10-03 ENCOUNTER — Inpatient Hospital Stay: Payer: 59 | Attending: Oncology | Admitting: Oncology

## 2020-10-03 VITALS — BP 148/99 | HR 92 | Resp 20

## 2020-10-03 VITALS — BP 155/120 | HR 107 | Temp 97.3°F | Resp 18 | Wt 204.4 lb

## 2020-10-03 DIAGNOSIS — Z720 Tobacco use: Secondary | ICD-10-CM

## 2020-10-03 DIAGNOSIS — I10 Essential (primary) hypertension: Secondary | ICD-10-CM | POA: Diagnosis not present

## 2020-10-03 DIAGNOSIS — F17210 Nicotine dependence, cigarettes, uncomplicated: Secondary | ICD-10-CM | POA: Diagnosis not present

## 2020-10-03 DIAGNOSIS — D751 Secondary polycythemia: Secondary | ICD-10-CM | POA: Insufficient documentation

## 2020-10-03 DIAGNOSIS — Z7901 Long term (current) use of anticoagulants: Secondary | ICD-10-CM | POA: Diagnosis not present

## 2020-10-03 DIAGNOSIS — I82431 Acute embolism and thrombosis of right popliteal vein: Secondary | ICD-10-CM

## 2020-10-03 DIAGNOSIS — Z8616 Personal history of COVID-19: Secondary | ICD-10-CM | POA: Insufficient documentation

## 2020-10-03 DIAGNOSIS — Z79899 Other long term (current) drug therapy: Secondary | ICD-10-CM | POA: Insufficient documentation

## 2020-10-03 DIAGNOSIS — I82451 Acute embolism and thrombosis of right peroneal vein: Secondary | ICD-10-CM | POA: Diagnosis not present

## 2020-10-03 LAB — CBC WITH DIFFERENTIAL/PLATELET
Abs Immature Granulocytes: 0.1 10*3/uL — ABNORMAL HIGH (ref 0.00–0.07)
Basophils Absolute: 0.1 10*3/uL (ref 0.0–0.1)
Basophils Relative: 1 %
Eosinophils Absolute: 0.3 10*3/uL (ref 0.0–0.5)
Eosinophils Relative: 2 %
HCT: 55.5 % — ABNORMAL HIGH (ref 39.0–52.0)
Hemoglobin: 20.1 g/dL — ABNORMAL HIGH (ref 13.0–17.0)
Immature Granulocytes: 1 %
Lymphocytes Relative: 22 %
Lymphs Abs: 2.9 10*3/uL (ref 0.7–4.0)
MCH: 37.8 pg — ABNORMAL HIGH (ref 26.0–34.0)
MCHC: 36.2 g/dL — ABNORMAL HIGH (ref 30.0–36.0)
MCV: 104.3 fL — ABNORMAL HIGH (ref 80.0–100.0)
Monocytes Absolute: 0.8 10*3/uL (ref 0.1–1.0)
Monocytes Relative: 7 %
Neutro Abs: 8.6 10*3/uL — ABNORMAL HIGH (ref 1.7–7.7)
Neutrophils Relative %: 67 %
Platelets: 313 10*3/uL (ref 150–400)
RBC: 5.32 MIL/uL (ref 4.22–5.81)
RDW: 13.1 % (ref 11.5–15.5)
WBC: 12.8 10*3/uL — ABNORMAL HIGH (ref 4.0–10.5)
nRBC: 0 % (ref 0.0–0.2)

## 2020-10-03 LAB — B12 AND FOLATE PANEL
Folate: 6.3 ng/mL (ref >3.0)
Vitamin B-12: 166 pg/mL — ABNORMAL LOW (ref 232–1245)

## 2020-10-03 LAB — HEPATIC FUNCTION PANEL
ALT: 22 U/L (ref 0–44)
AST: 32 U/L (ref 15–41)
Albumin: 4.2 g/dL (ref 3.5–5.0)
Alkaline Phosphatase: 61 U/L (ref 38–126)
Bilirubin, Direct: 0.2 mg/dL (ref 0.0–0.2)
Indirect Bilirubin: 1.1 mg/dL — ABNORMAL HIGH (ref 0.3–0.9)
Total Bilirubin: 1.3 mg/dL — ABNORMAL HIGH (ref 0.3–1.2)
Total Protein: 8.3 g/dL — ABNORMAL HIGH (ref 6.5–8.1)

## 2020-10-03 LAB — TECHNOLOGIST SMEAR REVIEW: WBC MORPHOLOGY: REACTIVE

## 2020-10-03 LAB — SPECIMEN STATUS REPORT

## 2020-10-03 NOTE — Progress Notes (Signed)
Hematology/Oncology Consult note    Patient Care Team: Duanne Limerick, MD as PCP - General (Family Medicine)  REFERRING PROVIDER: Duanne Limerick, MD  CHIEF COMPLAINTS/REASON FOR VISIT:  Evaluation of elevated hemoglobin and acute DVT  HISTORY OF PRESENTING ILLNESS:   Casey Kemp is a  45 y.o.  male with PMH listed below was seen in consultation at the request of  Yetta Barre, Deanna C, MD  for evaluation of elevated hemoglobin acute DVT  09/23/2020, patient presented to urgent care at [facility] for evaluation of right lower extremity calf pain for about a week and a half.  Patient thought that his he may have pulled a muscle.  Right lower extremity ultrasound showed occlusive DVT of the popliteal, peroneal, posterior tibial vein. Patient was started on Eliquis.  Per patient, he was advised to take Eliquis 5 mg twice daily.  Patient has tolerated anticoagulation well.  Denies any acute bleeding events. Swelling has improved and almost resolved. 09/26/2020, patient had a CBC done which showed hemoglobin of 21.5.  Hematocrit 60.3.  MCV 107.  Patient was referred to establish care with hematology for further evaluation. Patient denies any previous personal history of blood clots.  He had COVID 19 infection about 3 weeks prior to his DVT event.  Patient smokes half a pack of cigarettes per day.  He has cut down smoking since 3 years ago.  He used to smoke more than a pack a day. Patient denies testosterone replacement therapy.  Review of Systems  Constitutional:  Negative for appetite change, chills, fatigue, fever and unexpected weight change.  HENT:   Negative for hearing loss and voice change.   Eyes:  Negative for eye problems and icterus.  Respiratory:  Negative for chest tightness, cough and shortness of breath.   Cardiovascular:  Negative for chest pain and leg swelling.  Gastrointestinal:  Negative for abdominal distention and abdominal pain.  Endocrine: Negative for hot flashes.   Genitourinary:  Negative for difficulty urinating, dysuria and frequency.   Musculoskeletal:  Negative for arthralgias.  Skin:  Negative for itching and rash.  Neurological:  Negative for light-headedness and numbness.  Hematological:  Negative for adenopathy. Does not bruise/bleed easily.  Psychiatric/Behavioral:  Negative for confusion.    MEDICAL HISTORY:  Past Medical History:  Diagnosis Date   Hypertension     SURGICAL HISTORY: Past Surgical History:  Procedure Laterality Date   CHOLECYSTECTOMY      SOCIAL HISTORY: Social History   Socioeconomic History   Marital status: Single    Spouse name: Not on file   Number of children: Not on file   Years of education: Not on file   Highest education level: Not on file  Occupational History   Not on file  Tobacco Use   Smoking status: Every Day    Packs/day: 0.50    Years: 18.00    Pack years: 9.00    Types: Cigarettes   Smokeless tobacco: Never  Vaping Use   Vaping Use: Never used  Substance and Sexual Activity   Alcohol use: Yes    Comment: weekends/ beer   Drug use: Yes    Types: Marijuana   Sexual activity: Not Currently  Other Topics Concern   Not on file  Social History Narrative   Not on file   Social Determinants of Health   Financial Resource Strain: Not on file  Food Insecurity: Not on file  Transportation Needs: Not on file  Physical Activity: Not on file  Stress:  Not on file  Social Connections: Not on file  Intimate Partner Violence: Not on file    FAMILY HISTORY: Family History  Problem Relation Age of Onset   Healthy Mother    Lung cancer Father     ALLERGIES:  has No Known Allergies.  MEDICATIONS:  Current Outpatient Medications  Medication Sig Dispense Refill   APIXABAN (ELIQUIS) VTE STARTER PACK (10MG  AND 5MG ) Take as directed on package: start with two-5mg  tablets twice daily for 7 days. On day 8, switch to one-5mg  tablet twice daily. 1 each 0   lisinopril (ZESTRIL) 10 MG  tablet Take 1 tablet (10 mg total) by mouth daily. 60 tablet 2   lisinopril (ZESTRIL) 5 MG tablet Take 5 mg by mouth daily. (Patient not taking: Reported on 10/03/2020)     No current facility-administered medications for this visit.     PHYSICAL EXAMINATION: ECOG PERFORMANCE STATUS: 0 - Asymptomatic Vitals:   10/03/20 1109  BP: (!) 155/120  Pulse: (!) 107  Resp: 18  Temp: (!) 97.3 F (36.3 C)  SpO2: 100%   Filed Weights   10/03/20 1109  Weight: 204 lb 5.9 oz (92.7 kg)    Physical Exam Constitutional:      General: He is not in acute distress. HENT:     Head: Normocephalic and atraumatic.  Eyes:     General: No scleral icterus. Cardiovascular:     Rate and Rhythm: Normal rate and regular rhythm.     Heart sounds: Normal heart sounds.  Pulmonary:     Effort: Pulmonary effort is normal. No respiratory distress.     Breath sounds: No wheezing.     Comments: Decreased breath sound bilaterally Abdominal:     General: Bowel sounds are normal. There is no distension.     Palpations: Abdomen is soft.  Musculoskeletal:        General: No deformity. Normal range of motion.     Cervical back: Normal range of motion and neck supple.  Skin:    General: Skin is warm and dry.     Findings: No erythema or rash.  Neurological:     Mental Status: He is alert and oriented to person, place, and time. Mental status is at baseline.     Cranial Nerves: No cranial nerve deficit.     Coordination: Coordination normal.  Psychiatric:        Mood and Affect: Mood normal.    LABORATORY DATA:  I have reviewed the data as listed Lab Results  Component Value Date   WBC 12.8 (H) 10/03/2020   HGB 20.1 (H) 10/03/2020   HCT 55.5 (H) 10/03/2020   MCV 104.3 (H) 10/03/2020   PLT 313 10/03/2020   Recent Labs    09/26/20 0000 09/26/20 1652 10/03/20 1157  NA  --  139  --   K  --  4.8  --   CL  --  97  --   CO2  --  23  --   GLUCOSE  --  95  --   BUN  --  5*  --   CREATININE 0.9 0.91   --   CALCIUM  --  10.1  --   GFRNONAA 105  --   --   PROT  --   --  8.3*  ALBUMIN 4.8 4.8 4.2  AST  --   --  32  ALT  --   --  22  ALKPHOS  --   --  61  BILITOT  --   --  1.3*  BILIDIR  --   --  0.2  IBILI  --   --  1.1*   Iron/TIBC/Ferritin/ %Sat No results found for: IRON, TIBC, FERRITIN, IRONPCTSAT    RADIOGRAPHIC STUDIES: I have personally reviewed the radiological images as listed and agreed with the findings in the report. US Venous Img Lower Unilateral Right  Result Date: 09/23/2020 CLINICAL DATA:  Right lower extremity swelling and pain. EXAM: RIGHT LOWER EXTREMITY VENOUS DOPPLER ULTRASOUND TECHNIQUE: Gray-scale sonography with compression, as well as color and duplex ultrasound, were performed to evaluate the deep venous system(s) from the level of the common femoral vein through the popliteal and proximal calf veins. COMPARISON:  None. FINDINGS: VENOUS Normal compressibility of the common femoral and superficial femoral veins. Visualized portions of profunda femoral vein and great saphenous vein unremarkable. The right popliteal vein is distended and occluded by echogenic thrombus. Occlusive thrombus is also seen within the peroneal and posterior tibial veins. OTHER None. Limitations: none IMPRESSION: Positive for occlusive DVT in the right popliteal vein, peroneal, and posterior tibial veins. Electronically Signed   By: Danae Orleans M.D.   On: 09/23/2020 11:51      ASSESSMENT & PLAN:  1. Erythrocytosis   2. Acute deep vein thrombosis (DVT) of popliteal vein of right lower extremity (HCC)   3. Tobacco abuse    #Erythrocytosis, Primary versus secondary.  Patient is a current everyday smoker which may contribute to the erythrocytosis.  Rule out primary etiologies. Check CBC stat, LFT, JAK2 V617F mutation n with reflex to other mutations CALR, MPL, JAK 2 Ex 12-15 mutations, BCR ABL1 FISH,   CBC showed persistent erythrocytosis with Hb of 20.1, Hct 55.5. wbc 12.8. Recommend  patient to proceed with phlebotomy 500cc x 1 today, repeat another phlebotomy session- patient prefers to come next week.   Acute lower extremity DVT, continue Eliquis, he was not started on Eliquis loading dose and it has been 2 weeks since he starts Elqiuis. Lower extremity swelling is better. Continue Eliquis 5mg  BID.   Tobacco abuse, smoke cessation was discussed.   Follow up in 2 weeks for lab H&H MD phlebotomy, reviewing blood work up results  Orders Placed This Encounter  Procedures   CBC with Differential/Platelet    Standing Status:   Future    Number of Occurrences:   1    Standing Expiration Date:   10/03/2021   BCR-ABL1 FISH    Standing Status:   Future    Number of Occurrences:   1    Standing Expiration Date:   10/03/2021   Carbon monoxide, blood (performed at ref lab)    Standing Status:   Future    Number of Occurrences:   1    Standing Expiration Date:   10/03/2021   Erythropoietin    Standing Status:   Future    Number of Occurrences:   1    Standing Expiration Date:   10/03/2021   JAK2 V617F, w Reflex to CALR/E12/MPL    Standing Status:   Future    Number of Occurrences:   1    Standing Expiration Date:   10/03/2021   Hepatic function panel    Standing Status:   Future    Number of Occurrences:   1    Standing Expiration Date:   10/03/2021   Technologist smear review    Standing Status:   Future    Number of Occurrences:   1    Standing Expiration Date:   10/03/2021  All questions were answered. The patient knows to call the clinic with any problems questions or concerns.  cc Duanne LimerickJones, Deanna C, MD    Thank you for this kind referral and the opportunity to participate in the care of this patient. A copy of today's note is routed to referring provider    Rickard PatienceZhou Lavada Langsam, MD, PhD Hematology Oncology [facility]  10/03/2020

## 2020-10-04 ENCOUNTER — Encounter: Payer: Self-pay | Admitting: Family Medicine

## 2020-10-04 LAB — ERYTHROPOIETIN: Erythropoietin: 6.2 m[IU]/mL (ref 2.6–18.5)

## 2020-10-04 LAB — CARBON MONOXIDE, BLOOD (PERFORMED AT REF LAB): Carbon Monoxide, Blood: 4.9 % — ABNORMAL HIGH (ref 0.0–3.6)

## 2020-10-07 LAB — BCR-ABL1 FISH
Cells Analyzed: 200
Cells Counted: 200

## 2020-10-08 ENCOUNTER — Other Ambulatory Visit: Payer: Self-pay

## 2020-10-08 ENCOUNTER — Telehealth: Payer: Self-pay

## 2020-10-08 DIAGNOSIS — E538 Deficiency of other specified B group vitamins: Secondary | ICD-10-CM

## 2020-10-08 MED ORDER — VITAMIN B-12 1000 MCG PO TABS: 1000.0000 ug | ORAL_TABLET | Freq: Every day | ORAL | 1 refills | Status: AC

## 2020-10-08 NOTE — Progress Notes (Signed)
Sent in B12 to [facility]

## 2020-10-08 NOTE — Telephone Encounter (Signed)
-----   Message from Rickard Patience, MD sent at 10/07/2020 11:06 PM EDT ----- Thanks Delice Bison.  Talaysia Pinheiro, please recommend patient to start oral b12 daily. Please send a Rx. Thanks.   ----- Message ----- From: Everitt Amber Sent: 10/07/2020  11:55 AM EDT To: Rickard Patience, MD  Dr Yetta Barre wanted you to be aware of pt's B12 level. It is in chart

## 2020-10-08 NOTE — Telephone Encounter (Signed)
Looks like PCP office has already sent B12 supplement to pharmacy. Mychart message sent to pt recommending him to start supplement.

## 2020-10-10 ENCOUNTER — Inpatient Hospital Stay: Payer: 59

## 2020-10-10 ENCOUNTER — Other Ambulatory Visit: Payer: Self-pay

## 2020-10-10 VITALS — BP 153/107 | HR 78 | Temp 97.1°F | Resp 18

## 2020-10-10 DIAGNOSIS — D751 Secondary polycythemia: Secondary | ICD-10-CM

## 2020-10-11 LAB — CALR + JAK2 E12-15 + MPL (REFLEXED)

## 2020-10-11 LAB — JAK2 V617F, W REFLEX TO CALR/E12/MPL

## 2020-10-16 ENCOUNTER — Other Ambulatory Visit: Payer: Self-pay

## 2020-10-16 DIAGNOSIS — D751 Secondary polycythemia: Secondary | ICD-10-CM

## 2020-10-17 ENCOUNTER — Inpatient Hospital Stay (HOSPITAL_BASED_OUTPATIENT_CLINIC_OR_DEPARTMENT_OTHER): Payer: 59 | Admitting: Oncology

## 2020-10-17 ENCOUNTER — Inpatient Hospital Stay: Payer: 59

## 2020-10-17 ENCOUNTER — Encounter: Payer: Self-pay | Admitting: Oncology

## 2020-10-17 ENCOUNTER — Other Ambulatory Visit: Payer: Self-pay

## 2020-10-17 VITALS — BP 166/96 | HR 115 | Temp 97.6°F | Resp 18 | Wt 205.7 lb

## 2020-10-17 DIAGNOSIS — D751 Secondary polycythemia: Secondary | ICD-10-CM

## 2020-10-17 DIAGNOSIS — Z720 Tobacco use: Secondary | ICD-10-CM

## 2020-10-17 DIAGNOSIS — I82431 Acute embolism and thrombosis of right popliteal vein: Secondary | ICD-10-CM | POA: Diagnosis not present

## 2020-10-17 LAB — HEMOGLOBIN AND HEMATOCRIT, BLOOD
HCT: 48.6 % (ref 39.0–52.0)
Hemoglobin: 17.8 g/dL — ABNORMAL HIGH (ref 13.0–17.0)

## 2020-10-17 MED ORDER — APIXABAN 5 MG PO TABS: 5.0000 mg | ORAL_TABLET | Freq: Two times a day (BID) | ORAL | 1 refills | Status: AC

## 2020-10-17 NOTE — Progress Notes (Signed)
Hematology/Oncology Consult note    Patient Care Team: Duanne Limerick, MD as PCP - General (Family Medicine)  REFERRING PROVIDER: Duanne Limerick, MD  CHIEF COMPLAINTS/REASON FOR VISIT:  Evaluation of elevated hemoglobin and acute DVT  HISTORY OF PRESENTING ILLNESS:   Casey Kemp is a  45 y.o.  male with PMH listed below was seen in consultation at the request of  Yetta Barre, Deanna C, MD  for evaluation of elevated hemoglobin acute DVT  09/23/2020, patient presented to urgent care at [facility] for evaluation of right lower extremity calf pain for about a week and a half.  Patient thought that his he may have pulled a muscle.  Right lower extremity ultrasound showed occlusive DVT of the popliteal, peroneal, posterior tibial vein. Patient was started on Eliquis.  Per patient, he was advised to take Eliquis 5 mg twice daily.  Patient has tolerated anticoagulation well.  Denies any acute bleeding events. Swelling has improved and almost resolved. 09/26/2020, patient had a CBC done which showed hemoglobin of 21.5.  Hematocrit 60.3.  MCV 107.  Patient was referred to establish care with hematology for further evaluation. Patient denies any previous personal history of blood clots.  He had COVID 19 infection about 3 weeks prior to his DVT event.  Patient smokes half a pack of cigarettes per day.  He has cut down smoking since 3 years ago.  He used to smoke more than a pack a day. Patient denies testosterone replacement therapy.  INTERVAL HISTORY Casey Kemp is a 45 y.o. male who has above history reviewed by me today presents for follow up visit for management of erythrocytosis, acute DVT on anticoagulation. Problems and complaints are listed below: Patient has had phlebotomy sessions.  Today he presents to discuss results.  Lower extremity swelling/tenderness has improved.  Review of Systems  Constitutional:  Negative for appetite change, chills, fatigue, fever and unexpected weight  change.  HENT:   Positive for hearing loss. Negative for voice change.   Eyes:  Negative for eye problems and icterus.  Respiratory:  Negative for chest tightness, cough and shortness of breath.   Cardiovascular:  Negative for chest pain and leg swelling.  Gastrointestinal:  Negative for abdominal distention and abdominal pain.  Endocrine: Negative for hot flashes.  Genitourinary:  Negative for difficulty urinating, dysuria and frequency.   Musculoskeletal:  Negative for arthralgias.  Skin:  Negative for itching and rash.  Neurological:  Negative for light-headedness and numbness.  Hematological:  Negative for adenopathy. Does not bruise/bleed easily.  Psychiatric/Behavioral:  Negative for confusion.    MEDICAL HISTORY:  Past Medical History:  Diagnosis Date   Hypertension     SURGICAL HISTORY: Past Surgical History:  Procedure Laterality Date   CHOLECYSTECTOMY      SOCIAL HISTORY: Social History   Socioeconomic History   Marital status: Single    Spouse name: Not on file   Number of children: Not on file   Years of education: Not on file   Highest education level: Not on file  Occupational History   Not on file  Tobacco Use   Smoking status: Every Day    Packs/day: 0.50    Years: 18.00    Pack years: 9.00    Types: Cigarettes   Smokeless tobacco: Never  Vaping Use   Vaping Use: Never used  Substance and Sexual Activity   Alcohol use: Yes    Comment: weekends/ beer   Drug use: Yes    Types: Marijuana  Sexual activity: Not Currently  Other Topics Concern   Not on file  Social History Narrative   Not on file   Social Determinants of Health   Financial Resource Strain: Not on file  Food Insecurity: Not on file  Transportation Needs: Not on file  Physical Activity: Not on file  Stress: Not on file  Social Connections: Not on file  Intimate Partner Violence: Not on file    FAMILY HISTORY: Family History  Problem Relation Age of Onset   Healthy Mother     Lung cancer Father     ALLERGIES:  has No Known Allergies.  MEDICATIONS:  Current Outpatient Medications  Medication Sig Dispense Refill   [START ON 10/30/2020] apixaban (ELIQUIS) 5 MG TABS tablet Take 1 tablet (5 mg total) by mouth 2 (two) times daily. 60 tablet 1   APIXABAN (ELIQUIS) VTE STARTER PACK (10MG  AND 5MG ) Take as directed on package: start with two-5mg  tablets twice daily for 7 days. On day 8, switch to one-5mg  tablet twice daily. 1 each 0   lisinopril (ZESTRIL) 10 MG tablet Take 1 tablet (10 mg total) by mouth daily. 60 tablet 2   vitamin B-12 (CYANOCOBALAMIN) 1000 MCG tablet Take 1 tablet (1,000 mcg total) by mouth daily. 30 tablet 1   No current facility-administered medications for this visit.     PHYSICAL EXAMINATION: ECOG PERFORMANCE STATUS: 0 - Asymptomatic Vitals:   10/17/20 1338  BP: (!) 166/96  Pulse: (!) 115  Resp: 18  Temp: 97.6 F (36.4 C)  SpO2: 99%   Filed Weights   10/17/20 1338  Weight: 205 lb 11 oz (93.3 kg)    Physical Exam Constitutional:      General: He is not in acute distress. HENT:     Head: Normocephalic and atraumatic.  Eyes:     General: No scleral icterus. Cardiovascular:     Rate and Rhythm: Normal rate and regular rhythm.     Heart sounds: Normal heart sounds.  Pulmonary:     Effort: Pulmonary effort is normal. No respiratory distress.     Breath sounds: No wheezing.     Comments: Decreased breath sound bilaterally Abdominal:     General: Bowel sounds are normal. There is no distension.     Palpations: Abdomen is soft.  Musculoskeletal:        General: No deformity. Normal range of motion.     Cervical back: Normal range of motion and neck supple.  Skin:    General: Skin is warm and dry.     Findings: No erythema or rash.  Neurological:     Mental Status: He is alert and oriented to person, place, and time. Mental status is at baseline.     Cranial Nerves: No cranial nerve deficit.     Coordination: Coordination  normal.  Psychiatric:        Mood and Affect: Mood normal.    LABORATORY DATA:  I have reviewed the data as listed Lab Results  Component Value Date   WBC 12.8 (H) 10/03/2020   HGB 17.8 (H) 10/17/2020   HCT 48.6 10/17/2020   MCV 104.3 (H) 10/03/2020   PLT 313 10/03/2020   Recent Labs    09/26/20 0000 09/26/20 1652 10/03/20 1157  NA  --  139  --   K  --  4.8  --   CL  --  97  --   CO2  --  23  --   GLUCOSE  --  95  --  BUN  --  5*  --   CREATININE 0.9 0.91  --   CALCIUM  --  10.1  --   GFRNONAA 105  --   --   PROT  --   --  8.3*  ALBUMIN 4.8 4.8 4.2  AST  --   --  32  ALT  --   --  22  ALKPHOS  --   --  61  BILITOT  --   --  1.3*  BILIDIR  --   --  0.2  IBILI  --   --  1.1*    Iron/TIBC/Ferritin/ %Sat No results found for: IRON, TIBC, FERRITIN, IRONPCTSAT    RADIOGRAPHIC STUDIES: I have personally reviewed the radiological images as listed and agreed with the findings in the report. US Venous Img Lower Unilateral Right  Result Date: 09/23/2020 CLINICAL DATA:  Right lower extremity swelling and pain. EXAM: RIGHT LOWER EXTREMITY VENOUS DOPPLER ULTRASOUND TECHNIQUE: Gray-scale sonography with compression, as well as color and duplex ultrasound, were performed to evaluate the deep venous system(s) from the level of the common femoral vein through the popliteal and proximal calf veins. COMPARISON:  None. FINDINGS: VENOUS Normal compressibility of the common femoral and superficial femoral veins. Visualized portions of profunda femoral vein and great saphenous vein unremarkable. The right popliteal vein is distended and occluded by echogenic thrombus. Occlusive thrombus is also seen within the peroneal and posterior tibial veins. OTHER None. Limitations: none IMPRESSION: Positive for occlusive DVT in the right popliteal vein, peroneal, and posterior tibial veins. Electronically Signed   By: Danae Orleans M.D.   On: 09/23/2020 11:51      ASSESSMENT & PLAN:  1. Secondary  erythrocytosis   2. Acute deep vein thrombosis (DVT) of popliteal vein of right lower extremity (HCC)   3. Tobacco abuse    #Secondary erythrocytosis, Labs reviewed and discussed with patient. JAK2 V617F mutation negative, with reflex to other mutations CALR, MPL, JAK 2 Ex 12-15 mutations negative. BCR ABL1 FISH negative Elevated carbon monoxide level.  Likely secondary erythrocytosis due to smoking. Smoke cessation was discussed with patient. Phlebotomy if hematocrit is above 50.  Today hematocrit is less than 50 so no need for phlebotomy.   Acute lower extremity DVT, continue Eliquis,  Patient has been on anticoagulation for about 3 weeks. This is most likely a provoked event secondary to recent COVID-19 infection as well as secondary erythrocytosis.  Given that erythrocytosis will be monitored and controlled.  Recommend patient to finish 3 months of anticoagulation.  Refills of Eliquis 5 mg twice daily sent to pharmacy.   Follow up in 6 weeks for lab H&H  phlebotomy, lab CBC CMP 3 months +/- phlebotomy Orders Placed This Encounter  Procedures   CBC with Differential/Platelet    Standing Status:   Future    Standing Expiration Date:   10/17/2021   CBC with Differential/Platelet    Standing Status:   Future    Standing Expiration Date:   10/17/2021   Comprehensive metabolic panel    Standing Status:   Future    Standing Expiration Date:   10/17/2021    All questions were answered. The patient knows to call the clinic with any problems questions or concerns.  cc Duanne Limerick, MD   Rickard Patience, MD, PhD 10/17/2020

## 2020-10-17 NOTE — Progress Notes (Signed)
Pt here for follow up. No new concerns voiced.   

## 2020-10-24 ENCOUNTER — Other Ambulatory Visit: Payer: Self-pay

## 2020-10-24 ENCOUNTER — Ambulatory Visit (INDEPENDENT_AMBULATORY_CARE_PROVIDER_SITE_OTHER): Payer: 59 | Admitting: Family Medicine

## 2020-10-24 ENCOUNTER — Encounter: Payer: Self-pay | Admitting: Family Medicine

## 2020-10-24 VITALS — BP 136/86 | HR 98 | Ht 74.0 in | Wt 207.0 lb

## 2020-10-24 DIAGNOSIS — I10 Essential (primary) hypertension: Secondary | ICD-10-CM

## 2020-10-24 DIAGNOSIS — Z720 Tobacco use: Secondary | ICD-10-CM

## 2020-10-24 MED ORDER — LISINOPRIL 10 MG PO TABS: 10.0000 mg | ORAL_TABLET | Freq: Every day | ORAL | 1 refills | Status: AC

## 2020-10-24 NOTE — Progress Notes (Signed)
Date:  10/24/2020   Name:  Casey Kemp   DOB:  [birth year]   MRN:  [id number]   Chief Complaint: Hypertension (Follow up)  Hypertension This is a new problem. The current episode started more than 1 month ago. The problem has been gradually improving since onset. Pertinent negatives include no anxiety, blurred vision, chest pain, headaches, malaise/fatigue, neck pain, orthopnea, palpitations, peripheral edema or shortness of breath. Past treatments include ACE inhibitors. The current treatment provides moderate improvement. Compliance problems include diet and exercise.  There is no history of angina, kidney disease, CAD/MI, CVA, heart failure, left ventricular hypertrophy, PVD or retinopathy. There is no history of chronic renal disease, a hypertension causing med or renovascular disease.   Lab Results  Component Value Date   CREATININE 0.91 09/26/2020   BUN 5 (L) 09/26/2020   NA 139 09/26/2020   K 4.8 09/26/2020   CL 97 09/26/2020   CO2 23 09/26/2020   No results found for: CHOL, HDL, LDLCALC, LDLDIRECT, TRIG, CHOLHDL No results found for: TSH No results found for: HGBA1C Lab Results  Component Value Date   WBC 12.8 (H) 10/03/2020   HGB 17.8 (H) 10/17/2020   HCT 48.6 10/17/2020   MCV 104.3 (H) 10/03/2020   PLT 313 10/03/2020   Lab Results  Component Value Date   ALT 22 10/03/2020   AST 32 10/03/2020   ALKPHOS 61 10/03/2020   BILITOT 1.3 (H) 10/03/2020     Review of Systems  Constitutional:  Negative for chills, fever and malaise/fatigue.  HENT:  Negative for drooling, ear discharge, ear pain and sore throat.   Eyes:  Negative for blurred vision.  Respiratory:  Negative for cough, shortness of breath and wheezing.   Cardiovascular:  Negative for chest pain, palpitations, orthopnea and leg swelling.  Gastrointestinal:  Negative for abdominal pain, blood in stool, constipation, diarrhea and nausea.  Endocrine: Negative for polydipsia and polyuria.   Genitourinary:  Negative for dysuria, frequency, hematuria and urgency.  Musculoskeletal:  Negative for back pain, myalgias and neck pain.  Skin:  Negative for rash.  Allergic/Immunologic: Negative for environmental allergies.  Neurological:  Negative for dizziness and headaches.  Hematological:  Does not bruise/bleed easily.  Psychiatric/Behavioral:  Negative for suicidal ideas. The patient is not nervous/anxious.    Patient Active Problem List   Diagnosis Date Noted   Hypertension 10/03/2020   Erythrocytosis 10/03/2020   Acute deep vein thrombosis (DVT) of popliteal vein of right lower extremity (HCC) 10/03/2020   Tobacco abuse 10/03/2020    No Known Allergies  Past Surgical History:  Procedure Laterality Date   CHOLECYSTECTOMY      Social History   Tobacco Use   Smoking status: Every Day    Packs/day: 0.50    Years: 18.00    Pack years: 9.00    Types: Cigarettes   Smokeless tobacco: Never  Vaping Use   Vaping Use: Never used  Substance Use Topics   Alcohol use: Yes    Comment: weekends/ beer   Drug use: Yes    Types: Marijuana     Medication list has been reviewed and updated.  No outpatient medications have been marked as taking for the 10/24/20 encounter (Office Visit) with Duanne Limerick, MD.    [facility] 2/9 Scores 09/26/2020  PHQ - 2 Score 0  PHQ- 9 Score 0    GAD 7 : Generalized Anxiety Score 09/26/2020  Nervous, Anxious, on Edge 0  Control/stop worrying 0  Worry too much -  different things 0  Trouble relaxing 0  Restless 0  Easily annoyed or irritable 0  Afraid - awful might happen 0  Total GAD 7 Score 0    BP Readings from Last 3 Encounters:  10/17/20 (!) 166/96  10/10/20 (!) 153/107  10/03/20 (!) 148/99    Physical Exam Vitals and nursing note reviewed.  HENT:     Head: Normocephalic.     Right Ear: Tympanic membrane and external ear normal.     Left Ear: Tympanic membrane and external ear normal.     Nose: Nose normal.     Mouth/Throat:      Mouth: Mucous membranes are moist.  Eyes:     General: No scleral icterus.       Right eye: No discharge.        Left eye: No discharge.     Conjunctiva/sclera: Conjunctivae normal.     Pupils: Pupils are equal, round, and reactive to light.  Neck:     Thyroid: No thyromegaly.     Vascular: No JVD.     Trachea: No tracheal deviation.  Cardiovascular:     Rate and Rhythm: Normal rate and regular rhythm.     Heart sounds: Normal heart sounds. No murmur heard.   No friction rub. No gallop.  Pulmonary:     Effort: No respiratory distress.     Breath sounds: Normal breath sounds. No wheezing, rhonchi or rales.  Abdominal:     General: Bowel sounds are normal.     Palpations: Abdomen is soft. There is no mass.     Tenderness: There is no abdominal tenderness. There is no guarding or rebound.  Musculoskeletal:        General: No tenderness. Normal range of motion.     Cervical back: Normal range of motion and neck supple.  Lymphadenopathy:     Cervical: No cervical adenopathy.  Skin:    General: Skin is warm.     Findings: No rash.  Neurological:     Mental Status: He is alert and oriented to person, place, and time.     Cranial Nerves: No cranial nerve deficit.     Deep Tendon Reflexes: Reflexes are normal and symmetric.    Wt Readings from Last 3 Encounters:  10/24/20 207 lb (93.9 kg)  10/17/20 205 lb 11 oz (93.3 kg)  10/03/20 204 lb 5.9 oz (92.7 kg)    Ht 6\' 2"  (1.88 m)   Wt 207 lb (93.9 kg)   BMI 26.58 kg/m   Assessment and Plan:  1. Primary hypertension Chronic.  Controlled.  Stable.  Blood pressure on second repeat is 136/86.  Patient also has normal readings per readings at home in the 1 18-78 range.  We will continue lisinopril 10 mg once a day.  Will check renal function panel for GFR and electrolytes.  We will recheck patient in 4 months. - lisinopril (ZESTRIL) 10 MG tablet; Take 1 tablet (10 mg total) by mouth daily.  Dispense: 90 tablet; Refill: 1 - Renal  Function Panel  2. Tobacco abuse Patient has been advised of the health risks of smoking and counseled concerning cessation of tobacco products. I spent over 3 minutes for discussion and to answer questions.

## 2020-10-28 ENCOUNTER — Encounter (INDEPENDENT_AMBULATORY_CARE_PROVIDER_SITE_OTHER): Payer: Self-pay | Admitting: Vascular Surgery

## 2020-11-28 ENCOUNTER — Other Ambulatory Visit: Payer: 59

## 2021-02-24 ENCOUNTER — Ambulatory Visit: Payer: 59 | Admitting: Family Medicine

## 2021-03-18 ENCOUNTER — Ambulatory Visit: Payer: 59 | Admitting: Family Medicine

## 2021-03-24 ENCOUNTER — Other Ambulatory Visit: Payer: Self-pay | Admitting: Oncology

## 2021-03-25 ENCOUNTER — Encounter: Payer: Self-pay | Admitting: Oncology

## 2021-04-17 ENCOUNTER — Inpatient Hospital Stay: Payer: 59

## 2021-04-17 ENCOUNTER — Inpatient Hospital Stay: Payer: 59 | Admitting: Oncology

## 2021-04-17 ENCOUNTER — Encounter: Payer: Self-pay | Admitting: Oncology

## 2021-04-17 ENCOUNTER — Inpatient Hospital Stay: Payer: 59 | Attending: Oncology

## 2021-11-24 ENCOUNTER — Encounter (INDEPENDENT_AMBULATORY_CARE_PROVIDER_SITE_OTHER): Payer: Self-pay

## 2022-05-09 ENCOUNTER — Encounter: Payer: Self-pay | Admitting: Oncology

## 2022-05-09 ENCOUNTER — Inpatient Hospital Stay
Admission: EM | Admit: 2022-05-09 | Discharge: 2022-05-15 | DRG: 482 | Disposition: A | Discharge status: Skilled Nursing Facility | Payer: Managed Care, Other (non HMO) | Attending: Internal Medicine | Admitting: Internal Medicine

## 2022-05-09 ENCOUNTER — Other Ambulatory Visit: Payer: Self-pay

## 2022-05-09 ENCOUNTER — Inpatient Hospital Stay: Payer: Managed Care, Other (non HMO)

## 2022-05-09 ENCOUNTER — Emergency Department: Payer: Managed Care, Other (non HMO)

## 2022-05-09 DIAGNOSIS — Z801 Family history of malignant neoplasm of trachea, bronchus and lung: Secondary | ICD-10-CM

## 2022-05-09 DIAGNOSIS — Z720 Tobacco use: Secondary | ICD-10-CM | POA: Diagnosis not present

## 2022-05-09 DIAGNOSIS — Y92009 Unspecified place in unspecified non-institutional (private) residence as the place of occurrence of the external cause: Secondary | ICD-10-CM

## 2022-05-09 DIAGNOSIS — I10 Essential (primary) hypertension: Secondary | ICD-10-CM | POA: Diagnosis present

## 2022-05-09 DIAGNOSIS — Y9301 Activity, walking, marching and hiking: Secondary | ICD-10-CM | POA: Diagnosis present

## 2022-05-09 DIAGNOSIS — Z86718 Personal history of other venous thrombosis and embolism: Secondary | ICD-10-CM | POA: Diagnosis not present

## 2022-05-09 DIAGNOSIS — W010XXA Fall on same level from slipping, tripping and stumbling without subsequent striking against object, initial encounter: Secondary | ICD-10-CM | POA: Diagnosis present

## 2022-05-09 DIAGNOSIS — F1090 Alcohol use, unspecified, uncomplicated: Secondary | ICD-10-CM | POA: Diagnosis present

## 2022-05-09 DIAGNOSIS — S72141A Displaced intertrochanteric fracture of right femur, initial encounter for closed fracture: Secondary | ICD-10-CM | POA: Diagnosis present

## 2022-05-09 DIAGNOSIS — Z79899 Other long term (current) drug therapy: Secondary | ICD-10-CM | POA: Diagnosis not present

## 2022-05-09 DIAGNOSIS — D751 Secondary polycythemia: Secondary | ICD-10-CM | POA: Diagnosis present

## 2022-05-09 DIAGNOSIS — Z789 Other specified health status: Secondary | ICD-10-CM

## 2022-05-09 DIAGNOSIS — Z7901 Long term (current) use of anticoagulants: Secondary | ICD-10-CM

## 2022-05-09 DIAGNOSIS — S72001A Fracture of unspecified part of neck of right femur, initial encounter for closed fracture: Secondary | ICD-10-CM | POA: Diagnosis present

## 2022-05-09 DIAGNOSIS — F1010 Alcohol abuse, uncomplicated: Secondary | ICD-10-CM | POA: Diagnosis present

## 2022-05-09 DIAGNOSIS — H9190 Unspecified hearing loss, unspecified ear: Secondary | ICD-10-CM | POA: Diagnosis present

## 2022-05-09 DIAGNOSIS — F17210 Nicotine dependence, cigarettes, uncomplicated: Secondary | ICD-10-CM | POA: Diagnosis present

## 2022-05-09 DIAGNOSIS — W19XXXA Unspecified fall, initial encounter: Secondary | ICD-10-CM

## 2022-05-09 DIAGNOSIS — Z8616 Personal history of COVID-19: Secondary | ICD-10-CM

## 2022-05-09 HISTORY — DX: Acute embolism and thrombosis of unspecified deep veins of unspecified lower extremity: I82.409

## 2022-05-09 HISTORY — DX: Tobacco use: Z72.0

## 2022-05-09 HISTORY — DX: Unspecified hearing loss, unspecified ear: H91.90

## 2022-05-09 LAB — COMPREHENSIVE METABOLIC PANEL
ALT: 18 U/L (ref 0–44)
AST: 41 U/L (ref 15–41)
Albumin: 3.9 g/dL (ref 3.5–5.0)
Alkaline Phosphatase: 85 U/L (ref 38–126)
Anion gap: 9 (ref 5–15)
BUN: 7 mg/dL (ref 6–20)
CO2: 22 mmol/L (ref 22–32)
Calcium: 8.3 mg/dL — ABNORMAL LOW (ref 8.9–10.3)
Chloride: 107 mmol/L (ref 98–111)
Creatinine, Ser: 0.77 mg/dL (ref 0.61–1.24)
GFR, Estimated: >60 mL/min (ref >60)
Glucose, Bld: 109 mg/dL — ABNORMAL HIGH (ref 70–99)
Potassium: 4.7 mmol/L (ref 3.5–5.1)
Sodium: 138 mmol/L (ref 135–145)
Total Bilirubin: 1 mg/dL (ref 0.3–1.2)
Total Protein: 7.2 g/dL (ref 6.5–8.1)

## 2022-05-09 LAB — CBC WITH DIFFERENTIAL/PLATELET
Abs Immature Granulocytes: 0.12 10*3/uL — ABNORMAL HIGH (ref 0.00–0.07)
Basophils Absolute: 0.1 10*3/uL (ref 0.0–0.1)
Basophils Relative: 1 %
Eosinophils Absolute: 0.1 10*3/uL (ref 0.0–0.5)
Eosinophils Relative: 1 %
HCT: 53 % — ABNORMAL HIGH (ref 39.0–52.0)
Hemoglobin: 18.2 g/dL — ABNORMAL HIGH (ref 13.0–17.0)
Immature Granulocytes: 1 %
Lymphocytes Relative: 37 %
Lymphs Abs: 3.2 10*3/uL (ref 0.7–4.0)
MCH: 37.4 pg — ABNORMAL HIGH (ref 26.0–34.0)
MCHC: 34.3 g/dL (ref 30.0–36.0)
MCV: 108.8 fL — ABNORMAL HIGH (ref 80.0–100.0)
Monocytes Absolute: 0.4 10*3/uL (ref 0.1–1.0)
Monocytes Relative: 5 %
Neutro Abs: 4.9 10*3/uL (ref 1.7–7.7)
Neutrophils Relative %: 55 %
Platelets: 267 10*3/uL (ref 150–400)
RBC: 4.87 MIL/uL (ref 4.22–5.81)
RDW: 13.2 % (ref 11.5–15.5)
WBC: 8.8 10*3/uL (ref 4.0–10.5)
nRBC: 0 % (ref 0.0–0.2)

## 2022-05-09 LAB — PROTIME-INR
INR: 1 (ref 0.8–1.2)
Prothrombin Time: 13.1 seconds (ref 11.4–15.2)

## 2022-05-09 LAB — HIV ANTIBODY (ROUTINE TESTING W REFLEX): HIV Screen 4th Generation wRfx: NONREACTIVE

## 2022-05-09 LAB — TYPE AND SCREEN
ABO/RH(D): A POS
Antibody Screen: NEGATIVE

## 2022-05-09 LAB — URINALYSIS, ROUTINE W REFLEX MICROSCOPIC
Bilirubin Urine: NEGATIVE
Glucose, UA: NEGATIVE mg/dL
Hgb urine dipstick: NEGATIVE
Ketones, ur: NEGATIVE mg/dL
Leukocytes,Ua: NEGATIVE
Nitrite: NEGATIVE
Protein, ur: NEGATIVE mg/dL
Specific Gravity, Urine: 1.016 (ref 1.005–1.030)
pH: 5 (ref 5.0–8.0)

## 2022-05-09 LAB — APTT: aPTT: 24 seconds (ref 24–36)

## 2022-05-09 LAB — ETHANOL: Alcohol, Ethyl (B): 237 mg/dL — ABNORMAL HIGH (ref <10)

## 2022-05-09 MED ORDER — LORAZEPAM 2 MG PO TABS: 0.0000 mg | ORAL_TABLET | Freq: Two times a day (BID) | ORAL | Status: AC

## 2022-05-09 MED ORDER — CHLORHEXIDINE GLUCONATE 4 % EX LIQD: Freq: Once | CUTANEOUS | Status: AC

## 2022-05-09 MED ORDER — CEFAZOLIN SODIUM-DEXTROSE 2-4 GM/100ML-% IV SOLN
2.0000 g | INTRAVENOUS | Status: AC
  Administered 2022-05-10: 2 g via INTRAVENOUS
  Filled 2022-05-09: qty 100

## 2022-05-09 MED ORDER — MORPHINE SULFATE (PF) 2 MG/ML IV SOLN
2.0000 mg | INTRAVENOUS | Status: DC | PRN
  Administered 2022-05-09 – 2022-05-11 (×5): 2 mg via INTRAVENOUS
  Filled 2022-05-09 (×5): qty 1

## 2022-05-09 MED ORDER — LORAZEPAM 2 MG/ML IJ SOLN: 1.0000 mg | INTRAMUSCULAR | Status: AC | PRN

## 2022-05-09 MED ORDER — MORPHINE SULFATE (PF) 2 MG/ML IV SOLN: 1.0000 mg | INTRAVENOUS | Status: DC | PRN

## 2022-05-09 MED ORDER — DM-GUAIFENESIN ER 30-600 MG PO TB12: 1.0000 | ORAL_TABLET | Freq: Two times a day (BID) | ORAL | Status: DC | PRN

## 2022-05-09 MED ORDER — FOLIC ACID 1 MG PO TABS
1.0000 mg | ORAL_TABLET | Freq: Every day | ORAL | Status: DC
  Administered 2022-05-09 – 2022-05-15 (×6): 1 mg via ORAL
  Filled 2022-05-09 (×6): qty 1

## 2022-05-09 MED ORDER — CELECOXIB 200 MG PO CAPS
200.0000 mg | ORAL_CAPSULE | Freq: Every day | ORAL | Status: AC
  Administered 2022-05-09: 200 mg via ORAL
  Filled 2022-05-09 (×2): qty 1

## 2022-05-09 MED ORDER — THIAMINE MONONITRATE 100 MG PO TABS
100.0000 mg | ORAL_TABLET | Freq: Every day | ORAL | Status: DC
  Administered 2022-05-09 – 2022-05-15 (×7): 100 mg via ORAL
  Filled 2022-05-09 (×7): qty 1

## 2022-05-09 MED ORDER — ONDANSETRON HCL 4 MG/2ML IJ SOLN: 4.0000 mg | Freq: Three times a day (TID) | INTRAMUSCULAR | Status: DC | PRN

## 2022-05-09 MED ORDER — ALBUTEROL SULFATE (2.5 MG/3ML) 0.083% IN NEBU: 2.5000 mg | INHALATION_SOLUTION | RESPIRATORY_TRACT | Status: DC | PRN

## 2022-05-09 MED ORDER — METHOCARBAMOL 500 MG PO TABS
500.0000 mg | ORAL_TABLET | Freq: Three times a day (TID) | ORAL | Status: DC | PRN
  Administered 2022-05-09 – 2022-05-15 (×10): 500 mg via ORAL
  Filled 2022-05-09 (×10): qty 1

## 2022-05-09 MED ORDER — MUPIROCIN 2 % EX OINT
1.0000 | TOPICAL_OINTMENT | Freq: Two times a day (BID) | CUTANEOUS | Status: AC
  Administered 2022-05-09 – 2022-05-14 (×9): 1 via NASAL
  Filled 2022-05-09: qty 22

## 2022-05-09 MED ORDER — HYDROMORPHONE HCL 1 MG/ML IJ SOLN
0.5000 mg | Freq: Once | INTRAMUSCULAR | Status: AC
  Administered 2022-05-09: 0.5 mg via INTRAVENOUS
  Filled 2022-05-09: qty 0.5

## 2022-05-09 MED ORDER — SENNOSIDES-DOCUSATE SODIUM 8.6-50 MG PO TABS: 1.0000 | ORAL_TABLET | Freq: Every evening | ORAL | Status: DC | PRN

## 2022-05-09 MED ORDER — OXYCODONE-ACETAMINOPHEN 5-325 MG PO TABS
1.0000 | ORAL_TABLET | ORAL | Status: DC | PRN
  Administered 2022-05-09 – 2022-05-10 (×5): 1 via ORAL
  Filled 2022-05-09 (×5): qty 1

## 2022-05-09 MED ORDER — NICOTINE 21 MG/24HR TD PT24
21.0000 mg | MEDICATED_PATCH | Freq: Every day | TRANSDERMAL | Status: DC
  Administered 2022-05-11 – 2022-05-15 (×5): 21 mg via TRANSDERMAL
  Filled 2022-05-09 (×5): qty 1

## 2022-05-09 MED ORDER — LIDOCAINE 5 % EX PTCH
1.0000 | MEDICATED_PATCH | CUTANEOUS | Status: DC
  Administered 2022-05-09 – 2022-05-14 (×6): 1 via TRANSDERMAL
  Filled 2022-05-09 (×6): qty 1

## 2022-05-09 MED ORDER — LISINOPRIL 10 MG PO TABS
10.0000 mg | ORAL_TABLET | Freq: Every day | ORAL | Status: DC
  Administered 2022-05-09 – 2022-05-15 (×6): 10 mg via ORAL
  Filled 2022-05-09 (×6): qty 1

## 2022-05-09 MED ORDER — HYDRALAZINE HCL 20 MG/ML IJ SOLN: 5.0000 mg | INTRAMUSCULAR | Status: DC | PRN

## 2022-05-09 MED ORDER — ADULT MULTIVITAMIN W/MINERALS CH
1.0000 | ORAL_TABLET | Freq: Every day | ORAL | Status: DC
  Administered 2022-05-09 – 2022-05-15 (×6): 1 via ORAL
  Filled 2022-05-09 (×6): qty 1

## 2022-05-09 MED ORDER — HYDROCODONE-ACETAMINOPHEN 5-325 MG PO TABS
1.0000 | ORAL_TABLET | ORAL | Status: DC | PRN
  Administered 2022-05-10: 1 via ORAL
  Filled 2022-05-09: qty 1

## 2022-05-09 MED ORDER — LORAZEPAM 2 MG PO TABS
0.0000 mg | ORAL_TABLET | Freq: Four times a day (QID) | ORAL | Status: AC
  Administered 2022-05-11: 1 mg via ORAL
  Filled 2022-05-09: qty 1

## 2022-05-09 MED ORDER — SODIUM CHLORIDE 0.9 % IV SOLN: INTRAVENOUS | Status: DC

## 2022-05-09 MED ORDER — ACETAMINOPHEN 325 MG PO TABS
650.0000 mg | ORAL_TABLET | Freq: Four times a day (QID) | ORAL | Status: DC | PRN
  Administered 2022-05-11 – 2022-05-13 (×3): 650 mg via ORAL
  Filled 2022-05-09 (×3): qty 2

## 2022-05-09 MED ORDER — GABAPENTIN 400 MG PO CAPS
400.0000 mg | ORAL_CAPSULE | Freq: Two times a day (BID) | ORAL | Status: AC
  Administered 2022-05-09 – 2022-05-13 (×10): 400 mg via ORAL
  Filled 2022-05-09 (×10): qty 1

## 2022-05-09 MED ORDER — THIAMINE HCL 100 MG/ML IJ SOLN
100.0000 mg | Freq: Every day | INTRAMUSCULAR | Status: DC
  Filled 2022-05-09: qty 2

## 2022-05-09 MED ORDER — LORAZEPAM 1 MG PO TABS: 1.0000 mg | ORAL_TABLET | ORAL | Status: AC | PRN

## 2022-05-09 NOTE — ED Provider Notes (Signed)
[facility] Provider Note    Event Date/Time   First MD Initiated Contact with Patient 05/09/22 1417     (approximate)   History   Fall   HPI  Casey Kemp is a 47 y.o. male who is otherwise healthy comes in with concern for mechanical fall.  Patient reports mechanical fall leading directly onto the right hip.  Patient has shortening and rotation of the right hip.  Patient denies any LOC, chest pain, shortness of breath denies hitting his head.  Patient given C5 of fentanyl on route.  Patient has palpable pulse and has sensation.  He denies any other injuries.  He states that he was previously on Eliquis but that was over a year ago in the setting of blood clots from the vaccine but denies being on any blood thinner for over a year.  Patient reports that it was a mechanical fall when she slipped due to poor grips on his shoes.   Physical Exam   Triage Vital Signs: ED Triage Vitals  Enc Vitals Group     BP 05/09/22 1430 (!) 144/107     Pulse Rate 05/09/22 1420 91     Resp 05/09/22 1421 16     Temp 05/09/22 1421 97.8 F (36.6 C)     Temp src --      SpO2 05/09/22 1420 100 %     Weight --      Height --      Head Circumference --      Peak Flow --      Pain Score 05/09/22 1413 10     Pain Loc --      Pain Edu? --      Excl. in GC? --     Most recent vital signs: Vitals:   05/09/22 1421 05/09/22 1430  BP:  (!) 144/107  Pulse: 95 90  Resp: 16 14  Temp: 97.8 F (36.6 C)   SpO2: 99% 100%     General: Awake, no distress.  CV:  Good peripheral perfusion.  Resp:  Normal effort.  Abd:  No distention.  Other:  Patient's right leg is shortened and rotated good distal pulse sensation intact able to flex and extend the ankle No hematoma noted to the head.  No C-spine tenderness.  Full range of motion of neck.  No numbness, tingling There is no abrasions noted  ED Results / Procedures / Treatments   Labs (all labs ordered are listed, but  only abnormal results are displayed) Labs Reviewed  CBC WITH DIFFERENTIAL/PLATELET - Abnormal; Notable for the following components:      Result Value   Hemoglobin 18.2 (*)    HCT 53.0 (*)    MCV 108.8 (*)    MCH 37.4 (*)    Abs Immature Granulocytes 0.12 (*)    All other components within normal limits  COMPREHENSIVE METABOLIC PANEL - Abnormal; Notable for the following components:   Glucose, Bld 109 (*)    Calcium 8.3 (*)    All other components within normal limits     EKG  My interpretation of EKG:  Sinus rate of 90 without any ST elevation or T wave inversions, normal intervals like  RADIOLOGY I have reviewed the xray personally and interpreted and patient has an acute right intertrochanter femur fracture  PROCEDURES:  Critical Care performed: No  Procedures   MEDICATIONS ORDERED IN ED: Medications  HYDROmorphone (DILAUDID) injection 0.5 mg (0.5 mg Intravenous Given 05/09/22 1443)  IMPRESSION / MDM / ASSESSMENT AND PLAN / ED COURSE  I reviewed the triage vital signs and the nursing notes.   Patient's presentation is most consistent with acute presentation with potential threat to life or bodily function.   She comes in with mechanical fall onto right hip.  Patient neurovascularly intact suspect fracture versus dislocation.  Preop labs and EKG ordered.  He denies any head injury no evidence of hematoma on head.  He is got no C-spine tenderness he is not intoxicated.  Declines imaging  X-ray confirms fracture.  Discussed case with Dr. Hyacinth Meeker.  Will discuss with hospital team for admission  I spent asked patient about last time he ate or drink anything he denies eating or today but then does mention 2 beers.  Given the concern for possible alcohol on board I will add on EtOH and we discussed CT imaging due to concern for possible EtOH involvement as well as distracting imaging trial intracranial hemorrhage, cervical fracture.  Already discussed case with Dr. Clyde Lundborg  for admission and he is aware these Cts are pending.   The patient is on the cardiac monitor to evaluate for evidence of arrhythmia and/or significant heart rate changes.      FINAL CLINICAL IMPRESSION(S) / ED DIAGNOSES   Final diagnoses:  Fall, initial encounter  Closed fracture of right hip, initial encounter     Rx / DC Orders   ED Discharge Orders     None        Note:  This document was prepared using Dragon voice recognition software and may include unintentional dictation errors.   Concha Se, MD 05/09/22 1520

## 2022-05-09 NOTE — H&P (Signed)
History and Physical    Casey Kemp ZOX:[id number] DOB: 07/24/1975 DOA: 05/09/2022  Referring MD/NP/PA:   PCP: Duanne Limerick, MD   Patient coming from:  The patient is coming from home.     Chief Complaint: fall and right hip pain  HPI: Casey Kemp is a 47 y.o. male with medical history significant of hypertension, DVT (off anticoagulants), erythrocytosis, tobacco abuse, alcohol use, chronic hearing loss and HOH, who presents with fall and right hip pain.  Pt states that he drank 3 beers in AM in his brother's home. He was walking and tripped over his shoes and fell on right hip in early afternoon today.  No loss of conscious.  Denies head or neck injury.  He developed pain in right hip, which is constant, sharp, severe, nonradiating.  No leg numbness.  Patient denies chest pain, cough, shortness breath.  No nausea, vomiting, diarrhea or abdominal pain.  No symptoms of UTI.  He states that his fall is simply an accident. He states that he was previously on Eliquis for right leg DVT due to  covid19 infection, but that was over a year ago, and denies being on any blood thinner for over a year.  Patient states that he drinks beer mainly in weekend Friday and Saturday, 6 packs each time.  Data reviewed independently and ED Course: pt was found to have WBC 8.8, GFR> 60, temperature normal, blood pressure 144/107, heart rate 95, 90, RR 16, oxygen saturation 100% on room air.  Chest x-ray negative for irritation, acute subacute healing left ninth rib fracture.  CT head negative.  CT of C-spine negative for acute injury, but showed emphysema of lung.  X-ray of right hip/pelvis showed right intertrochanteric femur fracture.  Patient is admitted to MedSurg bed as inpatient.  Dr. Hyacinth Meeker of Ortho is consulted.  EKG: I have personally reviewed.  Sinus rhythm, QTc 448, low voltage, no ischemic change.   Review of Systems:   General: no fevers, chills, no body weight gain, fatigue HEENT:  no blurry vision, hearing changes or sore throat Respiratory: no dyspnea, coughing, wheezing CV: no chest pain, no palpitations GI: no nausea, vomiting, abdominal pain, diarrhea, constipation GU: no dysuria, burning on urination, increased urinary frequency, hematuria  Ext: no leg edema Neuro: no unilateral weakness, numbness, or tingling, no vision change. Has HOH. Has fall. Skin: no rash, no skin tear. MSK: has right hip pain Heme: No easy bruising.  Travel history: No recent long distant travel.   Allergy: No Known Allergies  Past Medical History:  Diagnosis Date   DVT (deep venous thrombosis)    HOH (hard of hearing)    Hypertension    Tobacco abuse     Past Surgical History:  Procedure Laterality Date   CHOLECYSTECTOMY      Social History:  reports that he has been smoking cigarettes. He has a 9.00 pack-year smoking history. He has never used smokeless tobacco. He reports current alcohol use. He reports current drug use. Drug: Marijuana.  Family History:  Family History  Problem Relation Age of Onset   Healthy Mother    Lung cancer Father      Prior to Admission medications   Medication Sig Start Date End Date Taking? Authorizing Provider  apixaban (ELIQUIS) 5 MG TABS tablet Take 1 tablet (5 mg total) by mouth 2 (two) times daily. 10/30/20   Rickard Patience, MD  lisinopril (ZESTRIL) 10 MG tablet Take 1 tablet (10 mg total) by mouth daily. 10/24/20  Duanne Limerick, MD  vitamin B-12 (CYANOCOBALAMIN) 1000 MCG tablet Take 1 tablet (1,000 mcg total) by mouth daily. 10/08/20   Duanne Limerick, MD    Physical Exam: Vitals:   05/09/22 1421 05/09/22 1430 05/09/22 1537 05/09/22 1604  BP:  (!) 144/107 (!) 150/99 (!) 125/90  Pulse: 95 90 (!) 101 96  Resp: 16 14 16 18   Temp: 97.8 F (36.6 C)   98 F (36.7 C)  SpO2: 99% 100% 98% 100%   General: Not in acute distress HEENT:       Eyes: PERRL, EOMI, no scleral icterus.       ENT: No discharge from the ears and nose, no pharynx  injection, no tonsillar enlargement.        Neck: No JVD, no bruit, no mass felt. Heme: No neck lymph node enlargement. Cardiac: S1/S2, RRR, No murmurs, No gallops or rubs. Respiratory: No rales, wheezing, rhonchi or rubs. GI: Soft, nondistended, nontender, no rebound pain, no organomegaly, BS present. GU: No hematuria Ext: No pitting leg edema bilaterally. 1+DP/PT pulse bilaterally. Musculoskeletal: Right leg is shortened and externally rotated, with tenderness in right hip Skin: No rashes.  Neuro: Alert, oriented X3, cranial nerves II-XII grossly intact except for chronic bilateral hearing loss, moves all extremities. Psych: Patient is not psychotic, no suicidal or hemocidal ideation.  Labs on Admission: I have personally reviewed following labs and imaging studies  CBC: Recent Labs  Lab 05/09/22 1417  WBC 8.8  NEUTROABS 4.9  HGB 18.2*  HCT 53.0*  MCV 108.8*  PLT 267   Basic Metabolic Panel: Recent Labs  Lab 05/09/22 1417  NA 138  K 4.7  CL 107  CO2 22  GLUCOSE 109*  BUN 7  CREATININE 0.77  CALCIUM 8.3*   GFR: CrCl cannot be calculated (Unknown ideal weight.). Liver Function Tests: Recent Labs  Lab 05/09/22 1417  AST 41  ALT 18  ALKPHOS 85  BILITOT 1.0  PROT 7.2  ALBUMIN 3.9   No results for input(s): "LIPASE", "AMYLASE" in the last 168 hours. No results for input(s): "AMMONIA" in the last 168 hours. Coagulation Profile: Recent Labs  Lab 05/09/22 1515  INR 1.0   Cardiac Enzymes: No results for input(s): "CKTOTAL", "CKMB", "CKMBINDEX", "TROPONINI" in the last 168 hours. BNP (last 3 results) No results for input(s): "PROBNP" in the last 8760 hours. HbA1C: No results for input(s): "HGBA1C" in the last 72 hours. CBG: No results for input(s): "GLUCAP" in the last 168 hours. Lipid Profile: No results for input(s): "CHOL", "HDL", "LDLCALC", "TRIG", "CHOLHDL", "LDLDIRECT" in the last 72 hours. Thyroid Function Tests: No results for input(s): "TSH",  "T4TOTAL", "FREET4", "T3FREE", "THYROIDAB" in the last 72 hours. Anemia Panel: No results for input(s): "VITAMINB12", "FOLATE", "FERRITIN", "TIBC", "IRON", "RETICCTPCT" in the last 72 hours. Urine analysis:    Component Value Date/Time   COLORURINE YELLOW (A) 05/09/2022 1900   APPEARANCEUR CLEAR (A) 05/09/2022 1900   LABSPEC 1.016 05/09/2022 1900   PHURINE 5.0 05/09/2022 1900   GLUCOSEU NEGATIVE 05/09/2022 1900   HGBUR NEGATIVE 05/09/2022 1900   BILIRUBINUR NEGATIVE 05/09/2022 1900   KETONESUR NEGATIVE 05/09/2022 1900   PROTEINUR NEGATIVE 05/09/2022 1900   NITRITE NEGATIVE 05/09/2022 1900   LEUKOCYTESUR NEGATIVE 05/09/2022 1900   Sepsis Labs: @LABRCNTIP (procalcitonin:4,lacticidven:4) )No results found for this or any previous visit (from the past 240 hour(s)).   Radiological Exams on Admission: CT HEAD WO CONTRAST ( )  Result Date: 05/09/2022 CLINICAL DATA:  Head trauma, abnormal mental status (Age 98-64y); Ataxia,  cervical trauma EXAM: CT HEAD WITHOUT CONTRAST CT CERVICAL SPINE WITHOUT CONTRAST TECHNIQUE: Multidetector CT imaging of the head and cervical spine was performed following the standard protocol without intravenous contrast. Multiplanar CT image reconstructions of the cervical spine were also generated. RADIATION DOSE REDUCTION: This exam was performed according to the departmental dose-optimization program which includes automated exposure control, adjustment of the mA and/or kV according to patient size and/or use of iterative reconstruction technique. COMPARISON:  None Available. FINDINGS: CT HEAD FINDINGS Brain: No evidence of acute infarction, hemorrhage, hydrocephalus, extra-axial collection or mass lesion/mass effect. Vascular: No hyperdense vessel or unexpected calcification. Skull: Normal. Negative for fracture or focal lesion. Sinuses/Orbits: No acute finding. Other: None. CT CERVICAL SPINE FINDINGS Alignment: Facet joints are aligned without dislocation or traumatic  listhesis. Dens and lateral masses are aligned. Skull base and vertebrae: No acute fracture. No primary bone lesion or focal pathologic process. Soft tissues and spinal canal: No prevertebral fluid or swelling. No visible canal hematoma. Disc levels:  Within normal limits. Upper chest: Emphysematous changes at the lung apices. Other: None. IMPRESSION: 1. No acute intracranial findings. 2. No acute fracture or subluxation of the cervical spine. 3. Emphysematous changes at the lung apices (ICD10-J43.9). Electronically Signed   By: Duanne Guess D.O.   On: 05/09/2022 16:03   CT Cervical Spine Wo Contrast  Result Date: 05/09/2022 CLINICAL DATA:  Head trauma, abnormal mental status (Age 36-64y); Ataxia, cervical trauma EXAM: CT HEAD WITHOUT CONTRAST CT CERVICAL SPINE WITHOUT CONTRAST TECHNIQUE: Multidetector CT imaging of the head and cervical spine was performed following the standard protocol without intravenous contrast. Multiplanar CT image reconstructions of the cervical spine were also generated. RADIATION DOSE REDUCTION: This exam was performed according to the departmental dose-optimization program which includes automated exposure control, adjustment of the mA and/or kV according to patient size and/or use of iterative reconstruction technique. COMPARISON:  None Available. FINDINGS: CT HEAD FINDINGS Brain: No evidence of acute infarction, hemorrhage, hydrocephalus, extra-axial collection or mass lesion/mass effect. Vascular: No hyperdense vessel or unexpected calcification. Skull: Normal. Negative for fracture or focal lesion. Sinuses/Orbits: No acute finding. Other: None. CT CERVICAL SPINE FINDINGS Alignment: Facet joints are aligned without dislocation or traumatic listhesis. Dens and lateral masses are aligned. Skull base and vertebrae: No acute fracture. No primary bone lesion or focal pathologic process. Soft tissues and spinal canal: No prevertebral fluid or swelling. No visible canal hematoma. Disc  levels:  Within normal limits. Upper chest: Emphysematous changes at the lung apices. Other: None. IMPRESSION: 1. No acute intracranial findings. 2. No acute fracture or subluxation of the cervical spine. 3. Emphysematous changes at the lung apices (ICD10-J43.9). Electronically Signed   By: Duanne Guess D.O.   On: 05/09/2022 16:03   DG Chest Portable 1 View  Result Date: 05/09/2022 CLINICAL DATA:  Right hip fracture. EXAM: PORTABLE CHEST 1 VIEW COMPARISON:  None Available. FINDINGS: The heart size and mediastinal contours are within normal limits. Both lungs are clear. Subacute mildly displaced fracture of the left lateral ninth rib with surrounding callus formation. IMPRESSION: 1. No active disease. 2. Subacute healing fracture of the left lateral ninth rib. Electronically Signed   By: Obie Dredge M.D.   On: 05/09/2022 15:03   DG Hip Unilat W or Wo Pelvis 2-3 Views Right  Result Date: 05/09/2022 CLINICAL DATA:  Fall. EXAM: DG HIP (WITH OR WITHOUT PELVIS) 2-3V RIGHT COMPARISON:  None Available. FINDINGS: Acute right intertrochanteric femur fracture with 90 degree varus angulation. No dislocation. Joint spaces  are preserved. Bone mineralization is normal. Soft tissues are unremarkable. IMPRESSION: 1. Acute right intertrochanteric femur fracture. Electronically Signed   By: Obie Dredge M.D.   On: 05/09/2022 15:01      Assessment/Plan Principal Problem:   Closed right hip fracture Active Problems:   Fall at home, initial encounter   Hypertension   Erythrocytosis   Tobacco abuse   Alcohol use   Assessment and Plan:  Closed right hip fracture: X-ray showed acute right intertrochanteric femur fracture.  Patient has moderate pain now. No neurovascular compromise. Orthopedic surgeon, Dr. Hyacinth Meeker was consulted, planninig to do surgery in AM  - will admit to Med-surg bed - Pain control: prn morphine, percocet and tyleno - When necessary Zofran for nausea - Robaxin for muscle spasm -  Lidoderm patch for pain - type and cross - INR/PTT - PT/OT when able to (not ordered now)  Fall at home, initial encounter: -PT/OT when able to  Hypertension: -IV hydralazine as needed -Lisinopril  Erythrocytosis: This is chronic issue. Pt was seen by Dr. Cathie Hoops of hematology.  Per her note on 10/17/2020, patient had negative JAK2 V617F mutation negative, with reflex to other mutations CALR, MPL, JAK 2 Ex 12-15 mutations negative. BCR ABL1 FISH was negative. It is possibly due to secondary erythrocytosis since patient had elevated elevated carbon monoxide level.  Likely secondary erythrocytosis due to smoking. Per Dr. Bethanne Ginger note, pt needs phlebotomy if hematocrit is above 50. Today his Hgb is 18.3 and HCT is 53.0. Since pt will need surgery, will not do phlebotomy due to possible blood loss.  After surgery, recheck CBC to determine if patient need phlebotomy.  Due to the erythrocytosis, patient has increased risk of developing blood clot.   -need to f/u with hematologist -recheck CBC after surgery  Tobacco abuse and alcohol use: No signs of alcohol withdrawal now. -Did counseling about importance of quitting smoking and cutting down the drinking -Nicotine patch -CIWA protocol       DVT ppx: SCD  Code Status: Full code  Family Communication:   Yes, patient's nephew at bed side.     Disposition Plan:  Anticipate discharge back to previous environment  Consults called:  Dr. Hyacinth Meeker of Ortho is consulted.  Admission status and Level of care: Med-Surg:   as inpt        Dispo: The patient is from: Home              Anticipated d/c is to:  To be determined primary need rehab              Anticipated d/c date is: 2 days              Patient currently is not medically stable to d/c.    Severity of Illness:  The appropriate patient status for this patient is INPATIENT. Inpatient status is judged to be reasonable and necessary in order to provide the required intensity of service to  ensure the patient's safety. The patient's presenting symptoms, physical exam findings, and initial radiographic and laboratory data in the context of their chronic comorbidities is felt to place them at high risk for further clinical deterioration. Furthermore, it is not anticipated that the patient will be medically stable for discharge from the hospital within 2 midnights of admission.   * I certify that at the point of admission it is my clinical judgment that the patient will require inpatient hospital care spanning beyond 2 midnights from the point of admission due to  high intensity of service, high risk for further deterioration and high frequency of surveillance required.*       Date of Service 05/09/2022    Lorretta Harp [facility]   If 7PM-7AM, please contact night-coverage [email or web address] 05/09/2022, 7:57 PM

## 2022-05-09 NOTE — Anesthesia Preprocedure Evaluation (Signed)
Anesthesia Evaluation  Patient identified by MRN, date of birth, ID band Patient awake    Reviewed: Allergy & Precautions, H&P , NPO status , Patient's Chart, lab work & pertinent test results  Airway Mallampati: III  TM Distance: >3 FB Neck ROM: full    Dental no notable dental hx.    Pulmonary Current Smoker and Patient abstained from smoking. CXR: IMPRESSION: 1. No active disease. 2. Subacute healing fracture of the left lateral ninth rib.    Pulmonary exam normal        Cardiovascular Exercise Tolerance: Good hypertension, Normal cardiovascular exam  H/o DVT (off anticoagulants)   Neuro/Psych negative neurological ROS  negative psych ROS   GI/Hepatic negative GI ROS,,,(+)     substance abuse  alcohol use and marijuana use  Endo/Other  negative endocrine ROS    Renal/GU      Musculoskeletal   Abdominal Normal abdominal exam  (+)   Peds  Hematology negative hematology ROS (+) erythrocytosis   Anesthesia Other Findings Closed right hip fracture  Past Medical History: No date: DVT (deep venous thrombosis) No date: HOH (hard of hearing) No date: Hypertension No date: Tobacco abuse  Past Surgical History: No date: CHOLECYSTECTOMY     Reproductive/Obstetrics negative OB ROS                             Anesthesia Physical Anesthesia Plan  ASA: 2  Anesthesia Plan: General/Spinal   Post-op Pain Management: Minimal or no pain anticipated   Induction: Intravenous  PONV Risk Score and Plan: 2 and Propofol infusion and Dexamethasone  Airway Management Planned: Natural Airway  Additional Equipment:   Intra-op Plan:   Post-operative Plan:   Informed Consent: I have reviewed the patients History and Physical, chart, labs and discussed the procedure including the risks, benefits and alternatives for the proposed anesthesia with the patient or authorized representative who  has indicated his/her understanding and acceptance.     Dental Advisory Given  Plan Discussed with: CRNA and Surgeon  Anesthesia Plan Comments:         Anesthesia Quick Evaluation

## 2022-05-09 NOTE — ED Triage Notes (Addendum)
Pt to ED via [facility] from home. Pt reports he was walking and tripped over his shoes and fell on right hip. Pt has shortening and rotation to right leg. Pt denies LOC and head trauma. fental given in route. 18g LW. Sensation intact. Pulses palpable. Pt reports right hip pain.

## 2022-05-09 NOTE — ED Notes (Signed)
Pt transported to CT ?

## 2022-05-10 ENCOUNTER — Inpatient Hospital Stay: Payer: Managed Care, Other (non HMO)

## 2022-05-10 ENCOUNTER — Other Ambulatory Visit: Payer: Self-pay

## 2022-05-10 ENCOUNTER — Inpatient Hospital Stay: Payer: Managed Care, Other (non HMO) | Admitting: Anesthesiology

## 2022-05-10 ENCOUNTER — Encounter
Admission: EM | Disposition: A | Discharge status: Skilled Nursing Facility | Payer: Self-pay | Source: Home / Self Care | Attending: Hospitalist

## 2022-05-10 DIAGNOSIS — S72001A Fracture of unspecified part of neck of right femur, initial encounter for closed fracture: Secondary | ICD-10-CM | POA: Diagnosis not present

## 2022-05-10 HISTORY — PX: INTRAMEDULLARY (IM) NAIL INTERTROCHANTERIC: SHX5875

## 2022-05-10 LAB — SURGICAL PCR SCREEN
MRSA, PCR: NEGATIVE
Staphylococcus aureus: POSITIVE — AB

## 2022-05-10 LAB — CBC
HCT: 41.7 % (ref 39.0–52.0)
Hemoglobin: 14.3 g/dL (ref 13.0–17.0)
MCH: 37.3 pg — ABNORMAL HIGH (ref 26.0–34.0)
MCHC: 34.3 g/dL (ref 30.0–36.0)
MCV: 108.9 fL — ABNORMAL HIGH (ref 80.0–100.0)
Platelets: 224 10*3/uL (ref 150–400)
RBC: 3.83 MIL/uL — ABNORMAL LOW (ref 4.22–5.81)
RDW: 13.4 % (ref 11.5–15.5)
WBC: 11.9 10*3/uL — ABNORMAL HIGH (ref 4.0–10.5)
nRBC: 0 % (ref 0.0–0.2)

## 2022-05-10 LAB — BASIC METABOLIC PANEL
Anion gap: 7 (ref 5–15)
BUN: 8 mg/dL (ref 6–20)
CO2: 24 mmol/L (ref 22–32)
Calcium: 8 mg/dL — ABNORMAL LOW (ref 8.9–10.3)
Chloride: 105 mmol/L (ref 98–111)
Creatinine, Ser: 0.75 mg/dL (ref 0.61–1.24)
GFR, Estimated: >60 mL/min (ref >60)
Glucose, Bld: 121 mg/dL — ABNORMAL HIGH (ref 70–99)
Potassium: 3.6 mmol/L (ref 3.5–5.1)
Sodium: 136 mmol/L (ref 135–145)

## 2022-05-10 SURGERY — FIXATION, FRACTURE, INTERTROCHANTERIC, WITH INTRAMEDULLARY ROD
Anesthesia: Spinal | Site: Hip | Laterality: Right

## 2022-05-10 MED ORDER — PROPOFOL 1000 MG/100ML IV EMUL
INTRAVENOUS | Status: AC
  Filled 2022-05-10: qty 100

## 2022-05-10 MED ORDER — VASOPRESSIN 20 UNIT/ML IV SOLN
INTRAVENOUS | Status: AC
  Filled 2022-05-10: qty 1

## 2022-05-10 MED ORDER — ACETAMINOPHEN 10 MG/ML IV SOLN: 1000.0000 mg | Freq: Once | INTRAVENOUS | Status: DC | PRN

## 2022-05-10 MED ORDER — PHENYLEPHRINE HCL-NACL 20-0.9 MG/250ML-% IV SOLN
INTRAVENOUS | Status: AC
  Filled 2022-05-10: qty 250

## 2022-05-10 MED ORDER — DROPERIDOL 2.5 MG/ML IJ SOLN: 0.6250 mg | Freq: Once | INTRAMUSCULAR | Status: DC | PRN

## 2022-05-10 MED ORDER — METOCLOPRAMIDE HCL 10 MG PO TABS: 5.0000 mg | ORAL_TABLET | Freq: Three times a day (TID) | ORAL | Status: DC | PRN

## 2022-05-10 MED ORDER — KETAMINE HCL 50 MG/5ML IJ SOSY
PREFILLED_SYRINGE | INTRAMUSCULAR | Status: AC
  Filled 2022-05-10: qty 5

## 2022-05-10 MED ORDER — MELOXICAM 7.5 MG PO TABS
15.0000 mg | ORAL_TABLET | Freq: Every day | ORAL | Status: DC
  Administered 2022-05-11 – 2022-05-15 (×5): 15 mg via ORAL
  Filled 2022-05-10 (×6): qty 2

## 2022-05-10 MED ORDER — SODIUM CHLORIDE 0.45 % IV SOLN: INTRAVENOUS | Status: DC

## 2022-05-10 MED ORDER — SODIUM CHLORIDE 0.9 % IR SOLN: Status: DC | PRN

## 2022-05-10 MED ORDER — LIDOCAINE HCL (PF) 2 % IJ SOLN
INTRAMUSCULAR | Status: AC
  Filled 2022-05-10: qty 10

## 2022-05-10 MED ORDER — PROPOFOL 500 MG/50ML IV EMUL
INTRAVENOUS | Status: DC | PRN
  Administered 2022-05-10: 100 ug/kg/min via INTRAVENOUS

## 2022-05-10 MED ORDER — EPHEDRINE SULFATE (PRESSORS) 50 MG/ML IJ SOLN
INTRAMUSCULAR | Status: DC | PRN
  Administered 2022-05-10: 15 mg via INTRAVENOUS
  Administered 2022-05-10: 10 mg via INTRAVENOUS

## 2022-05-10 MED ORDER — ONDANSETRON HCL 4 MG PO TABS: 4.0000 mg | ORAL_TABLET | Freq: Four times a day (QID) | ORAL | Status: DC | PRN

## 2022-05-10 MED ORDER — OXYCODONE HCL 5 MG PO TABS: 5.0000 mg | ORAL_TABLET | Freq: Once | ORAL | Status: DC | PRN

## 2022-05-10 MED ORDER — MENTHOL 3 MG MT LOZG: 1.0000 | LOZENGE | OROMUCOSAL | Status: DC | PRN

## 2022-05-10 MED ORDER — KETAMINE HCL 10 MG/ML IJ SOLN
INTRAMUSCULAR | Status: DC | PRN
  Administered 2022-05-10: 20 mg via INTRAVENOUS
  Administered 2022-05-10: 10 mg via INTRAVENOUS
  Administered 2022-05-10: 20 mg via INTRAVENOUS

## 2022-05-10 MED ORDER — ENOXAPARIN SODIUM 30 MG/0.3ML IJ SOSY
30.0000 mg | PREFILLED_SYRINGE | INTRAMUSCULAR | Status: DC
  Administered 2022-05-11 – 2022-05-14 (×4): 30 mg via SUBCUTANEOUS
  Filled 2022-05-10 (×4): qty 0.3

## 2022-05-10 MED ORDER — ACETAMINOPHEN 325 MG PO TABS
325.0000 mg | ORAL_TABLET | Freq: Four times a day (QID) | ORAL | Status: DC | PRN
  Filled 2022-05-10 (×2): qty 2

## 2022-05-10 MED ORDER — FENTANYL CITRATE (PF) 100 MCG/2ML IJ SOLN
INTRAMUSCULAR | Status: AC
  Filled 2022-05-10: qty 2

## 2022-05-10 MED ORDER — LACTATED RINGERS IV SOLN: INTRAVENOUS | Status: DC | PRN

## 2022-05-10 MED ORDER — SODIUM CHLORIDE (PF) 0.9 % IJ SOLN
INTRAMUSCULAR | Status: AC
  Filled 2022-05-10: qty 20

## 2022-05-10 MED ORDER — PROPOFOL 10 MG/ML IV BOLUS
INTRAVENOUS | Status: DC | PRN
  Administered 2022-05-10: 90 mg via INTRAVENOUS
  Administered 2022-05-10: 40 mg via INTRAVENOUS

## 2022-05-10 MED ORDER — MIDAZOLAM HCL 5 MG/5ML IJ SOLN
INTRAMUSCULAR | Status: DC | PRN
  Administered 2022-05-10: 2 mg via INTRAVENOUS

## 2022-05-10 MED ORDER — ONDANSETRON HCL 4 MG/2ML IJ SOLN
INTRAMUSCULAR | Status: AC
  Filled 2022-05-10: qty 4

## 2022-05-10 MED ORDER — DEXAMETHASONE SODIUM PHOSPHATE 10 MG/ML IJ SOLN
INTRAMUSCULAR | Status: AC
  Filled 2022-05-10: qty 2

## 2022-05-10 MED ORDER — 0.9 % SODIUM CHLORIDE (POUR BTL) OPTIME
TOPICAL | Status: DC | PRN
  Administered 2022-05-10: 1000 mL

## 2022-05-10 MED ORDER — ONDANSETRON HCL 4 MG/2ML IJ SOLN: 4.0000 mg | Freq: Four times a day (QID) | INTRAMUSCULAR | Status: DC | PRN

## 2022-05-10 MED ORDER — MAGNESIUM HYDROXIDE 400 MG/5ML PO SUSP
30.0000 mL | Freq: Every day | ORAL | Status: DC | PRN
  Filled 2022-05-10 (×2): qty 30

## 2022-05-10 MED ORDER — CEFAZOLIN SODIUM-DEXTROSE 2-4 GM/100ML-% IV SOLN
2.0000 g | Freq: Three times a day (TID) | INTRAVENOUS | Status: AC
  Administered 2022-05-10 – 2022-05-11 (×3): 2 g via INTRAVENOUS
  Filled 2022-05-10 (×3): qty 100

## 2022-05-10 MED ORDER — PHENOL 1.4 % MT LIQD: 1.0000 | OROMUCOSAL | Status: DC | PRN

## 2022-05-10 MED ORDER — FENTANYL CITRATE (PF) 100 MCG/2ML IJ SOLN: 25.0000 ug | INTRAMUSCULAR | Status: DC | PRN

## 2022-05-10 MED ORDER — HYDROCODONE-ACETAMINOPHEN 5-325 MG PO TABS
1.0000 | ORAL_TABLET | ORAL | Status: DC | PRN
  Administered 2022-05-10 – 2022-05-11 (×3): 2 via ORAL
  Filled 2022-05-10 (×3): qty 2

## 2022-05-10 MED ORDER — BUPIVACAINE-EPINEPHRINE (PF) 0.25% -1:200000 IJ SOLN
INTRAMUSCULAR | Status: DC | PRN
  Administered 2022-05-10: 30 mL via PERINEURAL

## 2022-05-10 MED ORDER — SENNA 8.6 MG PO TABS
1.0000 | ORAL_TABLET | Freq: Two times a day (BID) | ORAL | Status: DC
  Administered 2022-05-10 – 2022-05-15 (×11): 8.6 mg via ORAL
  Filled 2022-05-10 (×11): qty 1

## 2022-05-10 MED ORDER — BUPIVACAINE HCL (PF) 0.5 % IJ SOLN
INTRAMUSCULAR | Status: AC
  Filled 2022-05-10: qty 10

## 2022-05-10 MED ORDER — OXYCODONE HCL 5 MG/5ML PO SOLN: 5.0000 mg | Freq: Once | ORAL | Status: DC | PRN

## 2022-05-10 MED ORDER — FERROUS SULFATE 325 (65 FE) MG PO TABS
325.0000 mg | ORAL_TABLET | Freq: Every day | ORAL | Status: DC
  Administered 2022-05-11 – 2022-05-15 (×5): 325 mg via ORAL
  Filled 2022-05-10 (×5): qty 1

## 2022-05-10 MED ORDER — BUPIVACAINE HCL (PF) 0.5 % IJ SOLN
INTRAMUSCULAR | Status: DC | PRN
  Administered 2022-05-10: 2.6 mL via INTRATHECAL

## 2022-05-10 MED ORDER — METOCLOPRAMIDE HCL 5 MG/ML IJ SOLN: 5.0000 mg | Freq: Three times a day (TID) | INTRAMUSCULAR | Status: DC | PRN

## 2022-05-10 MED ORDER — MIDAZOLAM HCL 2 MG/2ML IJ SOLN
INTRAMUSCULAR | Status: AC
  Filled 2022-05-10: qty 2

## 2022-05-10 MED ORDER — EPHEDRINE 5 MG/ML INJ
INTRAVENOUS | Status: AC
  Filled 2022-05-10: qty 5

## 2022-05-10 MED ORDER — BISACODYL 10 MG RE SUPP: 10.0000 mg | Freq: Every day | RECTAL | Status: DC | PRN

## 2022-05-10 MED ORDER — ALUM & MAG HYDROXIDE-SIMETH 200-200-20 MG/5ML PO SUSP: 30.0000 mL | ORAL | Status: DC | PRN

## 2022-05-10 MED ORDER — FLEET ENEMA 7-19 GM/118ML RE ENEM: 1.0000 | ENEMA | Freq: Once | RECTAL | Status: DC | PRN

## 2022-05-10 MED ORDER — ROCURONIUM BROMIDE 10 MG/ML (PF) SYRINGE
PREFILLED_SYRINGE | INTRAVENOUS | Status: AC
  Filled 2022-05-10: qty 10

## 2022-05-10 MED ORDER — GLYCOPYRROLATE 0.2 MG/ML IJ SOLN
INTRAMUSCULAR | Status: AC
  Filled 2022-05-10: qty 1

## 2022-05-10 MED ORDER — PHENYLEPHRINE HCL-NACL 20-0.9 MG/250ML-% IV SOLN
INTRAVENOUS | Status: DC | PRN
  Administered 2022-05-10: 25 ug/min via INTRAVENOUS

## 2022-05-10 MED ORDER — MORPHINE SULFATE (PF) 2 MG/ML IV SOLN: 0.5000 mg | INTRAVENOUS | Status: DC | PRN

## 2022-05-10 MED ORDER — PROMETHAZINE HCL 25 MG/ML IJ SOLN: 6.2500 mg | INTRAMUSCULAR | Status: DC | PRN

## 2022-05-10 MED ORDER — FENTANYL CITRATE (PF) 100 MCG/2ML IJ SOLN
INTRAMUSCULAR | Status: DC | PRN
  Administered 2022-05-10 (×2): 50 ug via INTRAVENOUS

## 2022-05-10 MED ORDER — VASOPRESSIN 20 UNIT/ML IV SOLN
INTRAVENOUS | Status: DC | PRN
  Administered 2022-05-10: 1 [IU] via INTRAVENOUS
  Administered 2022-05-10 (×3): .5 [IU] via INTRAVENOUS

## 2022-05-10 MED ORDER — GLYCOPYRROLATE 0.2 MG/ML IJ SOLN
INTRAMUSCULAR | Status: DC | PRN
  Administered 2022-05-10: .2 mg via INTRAVENOUS

## 2022-05-10 SURGICAL SUPPLY — 45 items
APL PRP STRL LF DISP 70% ISPRP (MISCELLANEOUS) ×2
BIT DRILL CANN 16 HIP (BIT) IMPLANT
BIT DRILL CANN STP 6/9 HIP (BIT) IMPLANT
BIT DRILL LONG 4.2 (BIT) IMPLANT
BIT DRILL TAPERED 10 (BIT) IMPLANT
BLADE HELICAL TFNA 110 (Anchor) IMPLANT
BNDG CMPR 5X4 CHSV STRCH STRL (GAUZE/BANDAGES/DRESSINGS) ×1
BNDG COHESIVE 4X5 TAN STRL LF (GAUZE/BANDAGES/DRESSINGS) ×2 IMPLANT
CHLORAPREP W/TINT 26 (MISCELLANEOUS) ×4 IMPLANT
DRAPE C-ARMOR (DRAPES) IMPLANT
DRAPE INCISE 23X17 STRL (DRAPES) ×2 IMPLANT
DRAPE INCISE IOBAN 23X17 STRL (DRAPES) ×1 IMPLANT
DRSG AQUACEL AG ADV 3.5X10 (GAUZE/BANDAGES/DRESSINGS) IMPLANT
DRSG AQUACEL AG ADV 3.5X14 (GAUZE/BANDAGES/DRESSINGS) IMPLANT
ELECT REM PT RETURN 9FT ADLT (ELECTROSURGICAL) ×1
ELECTRODE REM PT RTRN 9FT ADLT (ELECTROSURGICAL) ×2 IMPLANT
GAUZE SPONGE 4X4 12PLY STRL (GAUZE/BANDAGES/DRESSINGS) ×2 IMPLANT
GAUZE XEROFORM 1X8 LF (GAUZE/BANDAGES/DRESSINGS) IMPLANT
GLOVE INDICATOR 8.0 STRL GRN (GLOVE) ×2 IMPLANT
GLOVE SURG ORTHO 8.5 STRL (GLOVE) ×2 IMPLANT
GOWN STRL REUS W/ TWL LRG LVL3 (GOWN DISPOSABLE) ×2 IMPLANT
GOWN STRL REUS W/TWL LRG LVL3 (GOWN DISPOSABLE) ×1
GOWN STRL REUS W/TWL LRG LVL4 (GOWN DISPOSABLE) ×2 IMPLANT
GUIDEWIRE 3.2X400 (WIRE) IMPLANT
KIT TURNOVER KIT A (KITS) ×2 IMPLANT
MANIFOLD NEPTUNE II (INSTRUMENTS) ×2 IMPLANT
MAT ABSORB  FLUID 56X50 GRAY (MISCELLANEOUS) ×1
MAT ABSORB FLUID 56X50 GRAY (MISCELLANEOUS) ×2 IMPLANT
NAIL TROCH FIX 10X170 130 (Nail) IMPLANT
NDL SPNL 18GX3.5 QUINCKE PK (NEEDLE) ×2 IMPLANT
NEEDLE SPNL 18GX3.5 QUINCKE PK (NEEDLE) ×1 IMPLANT
NS IRRIG 500ML POUR BTL (IV SOLUTION) ×2 IMPLANT
PACK HIP COMPR (MISCELLANEOUS) ×2 IMPLANT
SCREW LOCK STAR 5X38 (Screw) IMPLANT
SOL PREP PVP 2OZ (MISCELLANEOUS) ×1
SOLUTION PREP PVP 2OZ (MISCELLANEOUS) ×2 IMPLANT
STAPLER SKIN PROX 35W (STAPLE) ×2 IMPLANT
SUCTION FRAZIER HANDLE 10FR (MISCELLANEOUS) ×1
SUCTION TUBE FRAZIER 10FR DISP (MISCELLANEOUS) ×2 IMPLANT
SUT VIC AB 0 CT1 36 (SUTURE) ×2 IMPLANT
SUT VIC AB 2-0 CT1 27 (SUTURE) ×1
SUT VIC AB 2-0 CT1 TAPERPNT 27 (SUTURE) ×2 IMPLANT
SYR 30ML LL (SYRINGE) ×2 IMPLANT
TRAP FLUID SMOKE EVACUATOR (MISCELLANEOUS) ×2 IMPLANT
WATER STERILE IRR 500ML POUR (IV SOLUTION) ×2 IMPLANT

## 2022-05-10 NOTE — Progress Notes (Signed)
  PROGRESS NOTE    Casey Kemp  EZM:[id number] DOB: 1975/10/17 DOA: 05/09/2022 PCP: Duanne Limerick, MD  [id number]  LOS: 1 day   Brief hospital course:   Assessment & Plan: Casey Kemp is a 47 y.o. male with medical history significant of hypertension, DVT (off anticoagulants), erythrocytosis, tobacco abuse, alcohol use, chronic hearing loss and HOH, who presents with fall and right hip pain.    Closed right hip fracture:  X-ray showed acute right intertrochanteric femur fracture.   --right INTRAMEDULLARY (IM) NAIL INTERTROCHANTERIC today   Fall at home, initial encounter: -PT/OT when able to   Hypertension: --cont Lisinopril   Erythrocytosis:  This is chronic issue. Pt was seen by Dr. Cathie Hoops of hematology.  Per her note on 10/17/2020, patient had negative JAK2 V617F mutation negative, with reflex to other mutations CALR, MPL, JAK 2 Ex 12-15 mutations negative. BCR ABL1 FISH was negative. It is possibly due to secondary erythrocytosis since patient had elevated elevated carbon monoxide level.  Likely secondary erythrocytosis due to smoking. Per Dr. Bethanne Ginger note, pt needs phlebotomy if hematocrit is above 50. Today his Hgb is 18.3 and HCT is 53.0. Since pt will need surgery, will not do phlebotomy due to possible blood loss.  After surgery, recheck CBC to determine if patient need phlebotomy.  Due to the erythrocytosis, patient has increased risk of developing blood clot.   -need to f/u with hematologist   Tobacco abuse and alcohol use:  No signs of alcohol withdrawal now. -admitting physician did counseling about importance of quitting smoking and cutting down the drinking -Nicotine patch -CIWA protocol    DVT prophylaxis: Lovenox SQ Code Status: Full code  Family Communication: parents updated at bedside today Level of care: Med-Surg Dispo:   The patient is from: home Anticipated d/c is to: to be determined Anticipated d/c date is: to be determined   Subjective  and Interval History:  Pt underwent right INTRAMEDULLARY (IM) NAIL INTERTROCHANTERIC today.  Tolerated it well.   Objective: Vitals:   05/10/22 1000 05/10/22 1015 05/10/22 1030 05/10/22 1131  BP: 98/64 125/85 (!) 130/91 (!) 137/93  Pulse: 62 81 74 84  Resp: (!) 21 18 16 18   Temp:   98.2 F (36.8 C)   SpO2: 99% 100% 97% 100%    Intake/Output Summary (Last 24 hours) at 05/10/2022 1305 Last data filed at 05/10/2022 0932 Gross per 24 hour  Intake 2196.79 ml  Output 350 ml  Net 1846.79 ml   There were no vitals filed for this visit.  Examination:   Constitutional: NAD, AAOx3 HEENT: conjunctivae and lids normal, EOMI CV: No cyanosis.   RESP: normal respiratory effort, on RA Neuro: II - XII grossly intact.   Psych: Normal mood and affect.  Appropriate judgement and reason   Data Reviewed: I have personally reviewed labs and imaging studies  Time spent: 35 minutes  Darlin Priestly, MD [facility] If 7PM-7AM, please contact night-coverage 05/10/2022, 1:05 PM

## 2022-05-10 NOTE — Anesthesia Procedure Notes (Signed)
Spinal  Patient location during procedure: OR Start time: 05/10/2022 8:19 AM End time: 05/10/2022 8:24 AM Reason for block: surgical anesthesia Staffing Resident/CRNA: Lynden Oxford, CRNA Performed by: Lynden Oxford, CRNA Authorized by: Foye Deer, MD   Preanesthetic Checklist Completed: patient identified, IV checked, site marked, risks and benefits discussed, surgical consent, monitors and equipment checked, pre-op evaluation and timeout performed Spinal Block Patient position: left lateral decubitus Prep: DuraPrep Patient monitoring: heart rate, cardiac monitor, continuous pulse ox and blood pressure Approach: midline Location: L3-4 Injection technique: single-shot Needle Needle type: Pencan  Needle gauge: 25 G Needle length: 9 cm Assessment Sensory level: T4 Events: CSF return

## 2022-05-10 NOTE — Anesthesia Postprocedure Evaluation (Signed)
Anesthesia Post Note  Patient: Zakarya Frischman  Procedure(s) Performed: INTRAMEDULLARY (IM) NAIL INTERTROCHANTERIC (Right: Hip)  Patient location during evaluation: PACU Anesthesia Type: Spinal Level of consciousness: awake and alert Pain management: pain level controlled Vital Signs Assessment: post-procedure vital signs reviewed and stable Respiratory status: spontaneous breathing, nonlabored ventilation and respiratory function stable Cardiovascular status: blood pressure returned to baseline and stable Postop Assessment: no apparent nausea or vomiting and spinal receding Anesthetic complications: no   No notable events documented.   Last Vitals:  Vitals:   05/10/22 1030 05/10/22 1131  BP: (!) 130/91 (!) 137/93  Pulse: 74 84  Resp: 16 18  Temp: 36.8 C   SpO2: 97% 100%    Last Pain:  Vitals:   05/10/22 1030  PainSc: 0-No pain                 Foye Deer

## 2022-05-10 NOTE — Consult Note (Signed)
ORTHOPAEDIC CONSULTATION  REQUESTING PHYSICIAN: Darlin Priestly, MD  Chief Complaint: Right hip pain  HPI: Casey Kemp is a 47 y.o. male who complains of right hip pain after a fall yesterday afternoon at his brother's house.  The patient was brought to the emergency room where exam and x-rays revealed a comminuted intertrochanteric/base neck fracture of the right hip.  Patient was admitted for medical evaluation and surgical fixation of the fracture.  His health is generally good.  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  He was treated with Eliquis but has been off that for some time.  His blood alcohol level was 0.237.  He has been cleared for surgery by the medical service.  The risks and benefits of internal fixation of the fracture been discussed with the patient.  I advised him there was a remote risk of avascular necrosis of the femoral head.  He was wishes to proceed with surgery today.  Past Medical History:  Diagnosis Date   DVT (deep venous thrombosis)    HOH (hard of hearing)    Hypertension    Tobacco abuse    Past Surgical History:  Procedure Laterality Date   CHOLECYSTECTOMY     Social History   Socioeconomic History   Marital status: Single    Spouse name: Not on file   Number of children: Not on file   Years of education: Not on file   Highest education level: Not on file  Occupational History   Not on file  Tobacco Use   Smoking status: Every Day    Packs/day: 0.50    Years: 18.00    Additional pack years: 0.00    Total pack years: 9.00    Types: Cigarettes   Smokeless tobacco: Never  Vaping Use   Vaping Use: Never used  Substance and Sexual Activity   Alcohol use: Yes    Comment: weekends/ beer   Drug use: Yes    Types: Marijuana   Sexual activity: Not Currently  Other Topics Concern   Not on file  Social History Narrative   Not on file   Social Determinants of Health   Financial Resource Strain: Not on file  Food  Insecurity: No Food Insecurity (05/09/2022)   Hunger Vital Sign    Worried About Running Out of Food in the Last Year: Never true    Ran Out of Food in the Last Year: Never true  Transportation Needs: No Transportation Needs (05/09/2022)   PRAPARE - Administrator, Civil Service (Medical): No    Lack of Transportation (Non-Medical): No  Physical Activity: Not on file  Stress: Not on file  Social Connections: Not on file   Family History  Problem Relation Age of Onset   Healthy Mother    Lung cancer Father    No Known Allergies Prior to Admission medications   Medication Sig Start Date End Date Taking? Authorizing Provider  apixaban (ELIQUIS) 5 MG TABS tablet Take 1 tablet (5 mg total) by mouth 2 (two) times daily. 10/30/20   Rickard Patience, MD  lisinopril (ZESTRIL) 10 MG tablet Take 1 tablet (10 mg total) by mouth daily. 10/24/20   Duanne Limerick, MD  vitamin B-12 (CYANOCOBALAMIN) 1000 MCG tablet Take 1 tablet (1,000 mcg total) by mouth daily. 10/08/20   Duanne Limerick, MD   CT HEAD WO CONTRAST ( )  Result Date: 05/09/2022 CLINICAL DATA:  Head trauma, abnormal mental status (Age 65-64y);  Ataxia, cervical trauma EXAM: CT HEAD WITHOUT CONTRAST CT CERVICAL SPINE WITHOUT CONTRAST TECHNIQUE: Multidetector CT imaging of the head and cervical spine was performed following the standard protocol without intravenous contrast. Multiplanar CT image reconstructions of the cervical spine were also generated. RADIATION DOSE REDUCTION: This exam was performed according to the departmental dose-optimization program which includes automated exposure control, adjustment of the mA and/or kV according to patient size and/or use of iterative reconstruction technique. COMPARISON:  None Available. FINDINGS: CT HEAD FINDINGS Brain: No evidence of acute infarction, hemorrhage, hydrocephalus, extra-axial collection or mass lesion/mass effect. Vascular: No hyperdense vessel or unexpected calcification. Skull:  Normal. Negative for fracture or focal lesion. Sinuses/Orbits: No acute finding. Other: None. CT CERVICAL SPINE FINDINGS Alignment: Facet joints are aligned without dislocation or traumatic listhesis. Dens and lateral masses are aligned. Skull base and vertebrae: No acute fracture. No primary bone lesion or focal pathologic process. Soft tissues and spinal canal: No prevertebral fluid or swelling. No visible canal hematoma. Disc levels:  Within normal limits. Upper chest: Emphysematous changes at the lung apices. Other: None. IMPRESSION: 1. No acute intracranial findings. 2. No acute fracture or subluxation of the cervical spine. 3. Emphysematous changes at the lung apices (ICD10-J43.9). Electronically Signed   By: Duanne Guess D.O.   On: 05/09/2022 16:03   CT Cervical Spine Wo Contrast  Result Date: 05/09/2022 CLINICAL DATA:  Head trauma, abnormal mental status (Age 40-64y); Ataxia, cervical trauma EXAM: CT HEAD WITHOUT CONTRAST CT CERVICAL SPINE WITHOUT CONTRAST TECHNIQUE: Multidetector CT imaging of the head and cervical spine was performed following the standard protocol without intravenous contrast. Multiplanar CT image reconstructions of the cervical spine were also generated. RADIATION DOSE REDUCTION: This exam was performed according to the departmental dose-optimization program which includes automated exposure control, adjustment of the mA and/or kV according to patient size and/or use of iterative reconstruction technique. COMPARISON:  None Available. FINDINGS: CT HEAD FINDINGS Brain: No evidence of acute infarction, hemorrhage, hydrocephalus, extra-axial collection or mass lesion/mass effect. Vascular: No hyperdense vessel or unexpected calcification. Skull: Normal. Negative for fracture or focal lesion. Sinuses/Orbits: No acute finding. Other: None. CT CERVICAL SPINE FINDINGS Alignment: Facet joints are aligned without dislocation or traumatic listhesis. Dens and lateral masses are aligned.  Skull base and vertebrae: No acute fracture. No primary bone lesion or focal pathologic process. Soft tissues and spinal canal: No prevertebral fluid or swelling. No visible canal hematoma. Disc levels:  Within normal limits. Upper chest: Emphysematous changes at the lung apices. Other: None. IMPRESSION: 1. No acute intracranial findings. 2. No acute fracture or subluxation of the cervical spine. 3. Emphysematous changes at the lung apices (ICD10-J43.9). Electronically Signed   By: Duanne Guess D.O.   On: 05/09/2022 16:03   DG Chest Portable 1 View  Result Date: 05/09/2022 CLINICAL DATA:  Right hip fracture. EXAM: PORTABLE CHEST 1 VIEW COMPARISON:  None Available. FINDINGS: The heart size and mediastinal contours are within normal limits. Both lungs are clear. Subacute mildly displaced fracture of the left lateral ninth rib with surrounding callus formation. IMPRESSION: 1. No active disease. 2. Subacute healing fracture of the left lateral ninth rib. Electronically Signed   By: Obie Dredge M.D.   On: 05/09/2022 15:03   DG Hip Unilat W or Wo Pelvis 2-3 Views Right  Result Date: 05/09/2022 CLINICAL DATA:  Fall. EXAM: DG HIP (WITH OR WITHOUT PELVIS) 2-3V RIGHT COMPARISON:  None Available. FINDINGS: Acute right intertrochanteric femur fracture with 90 degree varus angulation. No dislocation. Joint  spaces are preserved. Bone mineralization is normal. Soft tissues are unremarkable. IMPRESSION: 1. Acute right intertrochanteric femur fracture. Electronically Signed   By: Obie Dredge M.D.   On: 05/09/2022 15:01    Positive ROS: All other systems have been reviewed and were otherwise negative with the exception of those mentioned in the HPI and as above.  Physical Exam: General: Alert, no acute distress Cardiovascular: No pedal edema Respiratory: No cyanosis, no use of accessory musculature GI: No organomegaly, abdomen is soft and non-tender Skin: No lesions in the area of chief  complaint Neurologic: Sensation intact distally Psychiatric: Patient is competent for consent with normal mood and affect Lymphatic: No axillary or cervical lymphadenopathy  MUSCULOSKELETAL: Patient is awake and alert and oriented.  Right leg is shortened externally rotated.  Skin is intact.  Neurovascular status is good distally.  The left lower extremity is normal to exam.  Both upper extremities and spine are normal.  Assessment: Comminuted intertrochanteric/base neck fracture right hip   Plan: Open reduction internal fixation right hip with a trochanteric fixation nail Due to history of DVT we will be very observant for for this.    Valinda Hoar, MD [phone]   05/10/2022 8:03 AM

## 2022-05-10 NOTE — Transfer of Care (Signed)
Immediate Anesthesia Transfer of Care Note  Patient: Casey Kemp  Procedure(s) Performed: INTRAMEDULLARY (IM) NAIL INTERTROCHANTERIC (Right: Hip)  Patient Location: PACU  Anesthesia Type:General and Spinal  Level of Consciousness: drowsy  Airway & Oxygen Therapy: Patient Spontanous Breathing and Patient connected to face mask oxygen  Post-op Assessment: Report given to RN and Post -op Vital signs reviewed and stable  Post vital signs: Reviewed and stable  Last Vitals:  Vitals Value Taken Time  BP 96/61 05/10/22 0938  Temp    Pulse 59 05/10/22 0942  Resp 13 05/10/22 0942  SpO2 92 % 05/10/22 0942  Vitals shown include unvalidated device data.  Last Pain:  Vitals:   05/10/22 0620  PainSc: Asleep         Complications: No notable events documented.

## 2022-05-10 NOTE — Op Note (Signed)
DATE OF SURGERY:  05/10/2022  TIME: 9:39 AM  PATIENT NAME:  Casey Kemp  AGE: 47 y.o.  PRE-OPERATIVE DIAGNOSIS: Comminuted, displaced, intertrochanteric/base neck fracture right hip    POST-OPERATIVE DIAGNOSIS:  SAME  PROCEDURE:  INTRAMEDULLARY (IM) NAIL INTERTROCHANTERIC  SURGEON:  Valinda Hoar  ASST:  EBL: 50 cc  COMPLICATIONS: None  OPERATIVE IMPLANTS: Synthes trochanteric femoral nail 10 mm / 130 degrees with interlocking helical blade 110 mm and distal locking screw 38 mm.  PREOPERATIVE INDICATIONS:  Casey Kemp is a 47 y.o. year old who fell and suffered a hip fracture. He was brought into the ER and then admitted and optimized and then elected for surgical intervention.    The risks benefits and alternatives were discussed with the patient including but not limited to the risks of nonoperative treatment, versus surgical intervention including infection, bleeding, nerve injury, malunion, nonunion, hardware prominence, hardware failure, need for hardware removal, blood clots, cardiopulmonary complications, morbidity, mortality, among others, and they were willing to proceed.  I also informed him that he was at some risk for avascular necrosis of the femoral head due to the involvement of the neck region.  However with a greater trochanteric fracture as well I felt that fixation of the fracture was indicated.  At his age a total hip replacement would otherwise be the treatment of choice which I did not feel was indicated at the onset.  OPERATIVE PROCEDURE:  The patient was brought to the operating room and placed in the supine position.  Spinal anesthesia was administered, with a foley. He was placed on the fracture table.  Closed reduction was performed under C-arm guidance. The length of the femur was also measured using fluoroscopy. Time out was then performed after sterile prep and drape. He received preoperative antibiotics.  Incision was made proximal to the  greater trochanter. A guidewire was placed in the appropriate position. Confirmation was made on AP and lateral views. The above-named nail was opened. I opened the proximal femur with a reamer. I then placed the nail by hand easily down. I did not need to ream the femur.  Once the nail was completely seated, I placed a guidepin into the femoral head into the center center position through a second incision.  I measured the length, and then reamed the lateral cortex and up into the head. I then placed the helical blade. Slight compression was applied. Anatomic fixation achieved. Bone quality was mediocre.  I then secured the proximal interlock.  The distal locking screw was then placed and after confirming the position of the fracture fragments and hardware I then removed the instruments, and took final C-arm pictures AP and lateral the entire length of the leg. The wounds were irrigated copiously and closed with Vicryl  followed by staples and dry sterile dressing. Sponge and needle count were correct.   The patient was awakened and returned to PACU in stable and satisfactory condition. There no complications and the patient tolerated the procedure well.  He will be partial weightbearing as tolerated, and will be on Lovenox  For DVT prophylaxis.     Valinda Hoar, M.D.

## 2022-05-10 NOTE — H&P (Signed)
THE PATIENT WAS SEEN PRIOR TO SURGERY TODAY.  HISTORY, ALLERGIES, HOME MEDICATIONS AND OPERATIVE PROCEDURE WERE REVIEWED. RISKS AND BENEFITS OF SURGERY DISCUSSED WITH PATIENT AGAIN.  NO CHANGES FROM INITIAL HISTORY AND PHYSICAL NOTED.    

## 2022-05-11 ENCOUNTER — Telehealth: Payer: Self-pay

## 2022-05-11 ENCOUNTER — Encounter: Payer: Self-pay | Admitting: Specialist

## 2022-05-11 DIAGNOSIS — S72001A Fracture of unspecified part of neck of right femur, initial encounter for closed fracture: Secondary | ICD-10-CM | POA: Diagnosis not present

## 2022-05-11 LAB — BASIC METABOLIC PANEL
Anion gap: 7 (ref 5–15)
BUN: 8 mg/dL (ref 6–20)
CO2: 27 mmol/L (ref 22–32)
Calcium: 8.1 mg/dL — ABNORMAL LOW (ref 8.9–10.3)
Chloride: 99 mmol/L (ref 98–111)
Creatinine, Ser: 0.82 mg/dL (ref 0.61–1.24)
GFR, Estimated: >60 mL/min (ref >60)
Glucose, Bld: 105 mg/dL — ABNORMAL HIGH (ref 70–99)
Potassium: 4 mmol/L (ref 3.5–5.1)
Sodium: 133 mmol/L — ABNORMAL LOW (ref 135–145)

## 2022-05-11 LAB — CBC
HCT: 38.8 % — ABNORMAL LOW (ref 39.0–52.0)
Hemoglobin: 13.5 g/dL (ref 13.0–17.0)
MCH: 38 pg — ABNORMAL HIGH (ref 26.0–34.0)
MCHC: 34.8 g/dL (ref 30.0–36.0)
MCV: 109.3 fL — ABNORMAL HIGH (ref 80.0–100.0)
Platelets: 213 10*3/uL (ref 150–400)
RBC: 3.55 MIL/uL — ABNORMAL LOW (ref 4.22–5.81)
RDW: 12.8 % (ref 11.5–15.5)
WBC: 15 10*3/uL — ABNORMAL HIGH (ref 4.0–10.5)
nRBC: 0 % (ref 0.0–0.2)

## 2022-05-11 LAB — MAGNESIUM: Magnesium: 1.8 mg/dL (ref 1.7–2.4)

## 2022-05-11 MED ORDER — OXYCODONE-ACETAMINOPHEN 5-325 MG PO TABS
1.0000 | ORAL_TABLET | ORAL | Status: DC | PRN
  Administered 2022-05-11 (×2): 2 via ORAL
  Administered 2022-05-12: 1 via ORAL
  Administered 2022-05-12 – 2022-05-15 (×16): 2 via ORAL
  Filled 2022-05-11 (×19): qty 2

## 2022-05-11 NOTE — Progress Notes (Signed)
  PROGRESS NOTE    Casey Kemp  IWP:[id number] DOB: 1975/10/14 DOA: 05/09/2022 PCP: Duanne Limerick, MD  [id number]/[id number]-AA  LOS: 2 days   Brief hospital course:   Assessment & Plan: Casey Kemp is a 47 y.o. male with medical history significant of hypertension, DVT (off anticoagulants), erythrocytosis, tobacco abuse, alcohol use, chronic hearing loss and HOH, who presents with fall and right hip pain.    Closed right hip fracture:  X-ray showed acute right intertrochanteric femur fracture.   --right INTRAMEDULLARY (IM) NAIL INTERTROCHANTERIC on 05/10/22 --PT rec SNF rehab --discharge on 81 mg ASA twice daily for 6 weeks --should remain 25% weightbearing on the right leg. --return to Dr. Hyacinth Meeker office in 2 weeks for exam and staple removal.   Fall at home, initial encounter:   Hypertension: --cont Lisinopril   Erythrocytosis:  This is chronic issue. Pt was seen by Dr. Cathie Hoops of hematology.  Per her note on 10/17/2020, patient had negative JAK2 V617F mutation negative, with reflex to other mutations CALR, MPL, JAK 2 Ex 12-15 mutations negative. BCR ABL1 FISH was negative. It is possibly due to secondary erythrocytosis since patient had elevated elevated carbon monoxide level.  Likely secondary erythrocytosis due to smoking. Per Dr. Bethanne Ginger note, pt needs phlebotomy if hematocrit is above 50. Today his Hgb is 18.3 and HCT is 53.0. Since pt will need surgery, will not do phlebotomy due to possible blood loss.  After surgery, recheck CBC to determine if patient need phlebotomy.  Due to the erythrocytosis, patient has increased risk of developing blood clot.   -need to f/u with hematologist   Tobacco abuse and alcohol use:  No signs of alcohol withdrawal now. -admitting physician did counseling about importance of quitting smoking and cutting down the drinking -Nicotine patch -CIWA protocol    DVT prophylaxis: Lovenox SQ Code Status: Full code  Family Communication:  Level of care:  Med-Surg Dispo:   The patient is from: home Anticipated d/c is to: SNF rehab Anticipated d/c date is: whenever bed available   Subjective and Interval History:  Pt worked with PT today and was suprisingly weak for his age.  Pt reported his leg sore.   Objective: Vitals:   05/11/22 0800 05/11/22 0834 05/11/22 1153 05/11/22 1758  BP: (!) 155/96 (!) 141/89 120/80 104/73  Pulse:   93 (!) 103  Resp:   14 16  Temp:   99 F (37.2 C) 98.6 F (37 C)  TempSrc:      SpO2:   93% 97%    Intake/Output Summary (Last 24 hours) at 05/11/2022 1858 Last data filed at 05/11/2022 5053 Gross per 24 hour  Intake 680 ml  Output 1150 ml  Net -470 ml   There were no vitals filed for this visit.  Examination:   Constitutional: NAD, AAOx3 HEENT: conjunctivae and lids normal, EOMI CV: No cyanosis.   RESP: normal respiratory effort, on RA Neuro: II - XII grossly intact.   Psych: Normal mood and affect.  Appropriate judgement and reason   Data Reviewed: I have personally reviewed labs and imaging studies  Time spent: 25 minutes  Darlin Priestly, MD [facility] If 7PM-7AM, please contact night-coverage 05/11/2022, 6:58 PM

## 2022-05-11 NOTE — Progress Notes (Signed)
Subjective: 1 Day Post-Op Procedure(s) (LRB): INTRAMEDULLARY (IM) NAIL INTERTROCHANTERIC (Right) Patient is alert sitting up in bed and oriented and comfortable.  Pain is mild to moderate.  He has been evaluated by PT and started therapy.  His hemoglobin is stable at 13.5.  Dressing has fair amount of dressing will be changed today. I discussed the patient's fracture with him in depth.  He had a complex fracture with greater trochanteric and femoral neck fracture combined.  I advised him that I felt it was reasonable to attempt to save his native bone and try to get it to heal as opposed to having a primary total hip arthroplasty at his age.  He understands and agrees with this approach at this time.  He understands that there is a chance that the fracture will not heal and that he may need to undergo further surgery at a later date.  Patient reports pain as mild.  Objective:   VITALS:   Vitals:   05/11/22 0834 05/11/22 1153  BP: (!) 141/89 120/80  Pulse:  93  Resp:  14  Temp:  99 F (37.2 C)  SpO2:  93%    Neurologically intact Incision: moderate drainage  LABS Recent Labs    05/09/22 1417 05/10/22 0439 05/11/22 0457  HGB 18.2* 14.3 13.5  HCT 53.0* 41.7 38.8*  WBC 8.8 11.9* 15.0*  PLT 267 224 213    Recent Labs    05/09/22 1417 05/10/22 0439 05/11/22 0457  NA 138 136 133*  K 4.7 3.6 4.0  BUN 7 8 8   CREATININE 0.77 0.75 0.82  GLUCOSE 109* 121* 105*    Recent Labs    05/09/22 1515  INR 1.0     Assessment/Plan: 1 Day Post-Op Procedure(s) (LRB): INTRAMEDULLARY (IM) NAIL INTERTROCHANTERIC (Right)   Advance diet Up with therapy Discharge home with home health when safe per physical therapy Will discharge home on 81 mg ASA twice daily for 6 weeks He should remain 25% weightbearing on the right leg. He will return to my office in 2 weeks for exam and staple removal.

## 2022-05-11 NOTE — Progress Notes (Signed)
Physical Therapy Treatment Patient Details Name: Casey Kemp MRN: [id number] DOB: 1975/06/15 Today's Date: 05/11/2022   History of Present Illness Pt is a 47 yo M diagnosed with comminuted, displaced, intertrochanteric/base neck fracture of the right hip secondary to a fall and is now s/p IM nail.  PMH includes HTN, HOH, tobacco abuse, and alcohol use.    PT Comments    Pt was pleasant and motivated to participate during the session and put forth good effort throughout. Pt required significant physical assistance along with extra time and effort with functional tasks but made good progress compared to prior session.  Pt required significantly less assist with bed mobility tasks and was able to take several small steps from the bed to the chair with cues for sequencing for WB compliance.  Pt will benefit from continued PT services upon discharge to safely address deficits listed in patient problem list for decreased caregiver assistance and eventual return to PLOF.     Recommendations for follow up therapy are one component of a multi-disciplinary discharge planning process, led by the attending physician.  Recommendations may be updated based on patient status, additional functional criteria and insurance authorization.  Follow Up Recommendations  Can patient physically be transported by private vehicle: No    Assistance Recommended at Discharge Frequent or constant Supervision/Assistance  Patient can return home with the following Two people to help with walking and/or transfers;A lot of help with bathing/dressing/bathroom;Assistance with cooking/housework;Assist for transportation;Help with stairs or ramp for entrance   Equipment Recommendations  Other (comment) (TBD)    Recommendations for Other Services       Precautions / Restrictions Precautions Precautions: Fall Restrictions Weight Bearing Restrictions: Yes RLE Weight Bearing: Partial weight bearing RLE Partial Weight  Bearing Percentage or Pounds: 25%     Mobility  Bed Mobility Overal bed mobility: Needs Assistance Bed Mobility: Supine to Sit     Supine to sit: Mod assist Sit to supine: Total assist, +2 for physical assistance   General bed mobility comments: Mod A for RLE and trunk control with use of bed rails    Transfers Overall transfer level: Needs assistance Equipment used: Rolling walker (2 wheels) Transfers: Sit to/from Stand Sit to Stand: Min assist, +2 physical assistance, +2 safety/equipment           General transfer comment: Mod verbal cues for sequencing for WB compliance    Ambulation/Gait Ambulation/Gait assistance: Min assist Gait Distance (Feet): 3 Feet Assistive device: Rolling walker (2 wheels) Gait Pattern/deviations: Step-to pattern, Decreased stance time - right, Antalgic Gait velocity: decreased     General Gait Details: Max verbal and visual cues for proper sequencing for WB compliance with pt able to take 4-5 very small, effortful steps at the EOB and to chair   Stairs             Wheelchair Mobility    Modified Rankin (Stroke Patients Only)       Balance Overall balance assessment: Needs assistance   Sitting balance-Leahy Scale: Normal     Standing balance support: Bilateral upper extremity supported, During functional activity, Reliant on assistive device for balance Standing balance-Leahy Scale: Fair                              Cognition Arousal/Alertness: Awake/alert Behavior During Therapy: WFL for tasks assessed/performed Overall Cognitive Status: Within Functional Limits for tasks assessed  Exercises Total Joint Exercises Ankle Circles/Pumps: AROM, Strengthening, Both, 10 reps Quad Sets: Strengthening, Both, 10 reps Gluteal Sets: Strengthening, Both, 10 reps Long Arc Quad: AROM, Strengthening, 5 reps, Both Knee Flexion: AROM, Strengthening, 5 reps,  Both Other Exercises Other Exercises: HEP education for BLE APs, QS, and GS    General Comments        Pertinent Vitals/Pain Pain Assessment Pain Assessment: 0-10 Pain Score: 8  Pain Location: R hip Pain Descriptors / Indicators: Aching, Sore Pain Intervention(s): Repositioned, Premedicated before session, Monitored during session, Ice applied    Home Living                          Prior Function            PT Goals (current goals can now be found in the care plan section) Progress towards PT goals: Progressing toward goals    Frequency    BID      PT Plan Current plan remains appropriate    Co-evaluation              AM-PAC PT "6 Clicks" Mobility   Outcome Measure  Help needed turning from your back to your side while in a flat bed without using bedrails?: A Lot Help needed moving from lying on your back to sitting on the side of a flat bed without using bedrails?: A Lot Help needed moving to and from a bed to a chair (including a wheelchair)?: A Lot Help needed standing up from a chair using your arms (e.g., wheelchair or bedside chair)?: A Lot Help needed to walk in hospital room?: Total Help needed climbing 3-5 steps with a railing? : Total 6 Click Score: 10    End of Session Equipment Utilized During Treatment: Gait belt Activity Tolerance: Patient limited by pain Patient left: in chair;with call bell/phone within reach;with chair alarm set;with SCD's reapplied Nurse Communication: Mobility status;Weight bearing status PT Visit Diagnosis: Unsteadiness on feet (R26.81);Other abnormalities of gait and mobility (R26.89);Muscle weakness (generalized) (M62.81);Pain Pain - Right/Left: Right Pain - part of body: Hip     Time: 6712-4580 PT Time Calculation (min) (ACUTE ONLY): 17 min  Charges:  $Therapeutic Exercise: 8-22 mins                     D. Elly Modena PT, DPT 05/11/22, 4:22 PM

## 2022-05-11 NOTE — Progress Notes (Signed)
Nutrition Brief Note  RD consulted for assessment of nutritional requirements/ status.   Wt Readings from Last 15 Encounters:  10/24/20 93.9 kg  10/17/20 93.3 kg  10/03/20 92.7 kg  09/26/20 92.1 kg  12/09/18 90.7 kg   Pt with medical history significant of hypertension, DVT (off anticoagulants), erythrocytosis, tobacco abuse, alcohol use, chronic hearing loss and HOH, who presents with fall and right hip pain.  Pt admitted with rt hip fracture.    4/14- s/p INTRAMEDULLARY (IM) NAIL INTERTROCHANTERIC   Reviewed I/O's: +300 ml x 24 hours and +1.5 L since admission  UOP: 1.3 L x 24 hours  Spoke with pt at bedside, who reports feeling better but complains of pain and legs. He reports he is trying to limit his fluid intake as it hurts for him to go to the bathroom. Pt eating meals as well as outside food brought in by family. Pt reports good appetite PTA, often consuming 3 meals per day.   Pt denies any weight loss. He reports his UBW is around 189#.   Nutrition-Focused physical exam completed. Findings are no fat depletion, no muscle depletion, and no edema.    Medications reviewed and include ativan, folic acid, and thiamine.   Labs reviewed.  Na: 133.   Current diet order is regular, patient is consuming approximately 100% of meals at this time. Labs and medications reviewed.   No nutrition interventions warranted at this time. If nutrition issues arise, please consult RD.   Levada Schilling, RD, LDN, CDCES Registered Dietitian II Certified Diabetes Care and Education Specialist Please refer to [facility] for RD and/or RD on-call/weekend/after hours pager

## 2022-05-11 NOTE — Evaluation (Signed)
Physical Therapy Evaluation Patient Details Name: Casey Kemp MRN: [id number] DOB: 1976/01/22 Today's Date: 05/11/2022  History of Present Illness  Pt is a 47 yo M diagnosed with comminuted, displaced, intertrochanteric/base neck fracture of the right hip secondary to a fall and is now s/p IM nail.  PMH includes HTN, HOH, tobacco abuse, and alcohol use.   Clinical Impression  Pt pre-medicated prior to session but was profoundly pain limited during the session.  Pt required +2 total assist with bed mobility tasks and once in standing was only able to take several very small, effortful steps near the EOB before needing to return to sitting.  Pt required extensive cuing with transfers and gait for RLE WB compliance.  Pt will benefit from continued PT services upon discharge to safely address deficits listed in patient problem list for decreased caregiver assistance and eventual return to PLOF.      Recommendations for follow up therapy are one component of a multi-disciplinary discharge planning process, led by the attending physician.  Recommendations may be updated based on patient status, additional functional criteria and insurance authorization.  Follow Up Recommendations Can patient physically be transported by private vehicle: No     Assistance Recommended at Discharge Frequent or constant Supervision/Assistance  Patient can return home with the following  Two people to help with walking and/or transfers;A lot of help with bathing/dressing/bathroom;Assistance with cooking/housework;Assist for transportation;Help with stairs or ramp for entrance    Equipment Recommendations Other (comment) (TBD at next venue of care)  Recommendations for Other Services       Functional Status Assessment Patient has had a recent decline in their functional status and demonstrates the ability to make significant improvements in function in a reasonable and predictable amount of time.      Precautions / Restrictions Precautions Precautions: Fall Restrictions Weight Bearing Restrictions: Yes RLE Weight Bearing: Partial weight bearing RLE Partial Weight Bearing Percentage or Pounds: 25%      Mobility  Bed Mobility Overal bed mobility: Needs Assistance Bed Mobility: Supine to Sit, Sit to Supine     Supine to sit: Total assist, +2 for physical assistance Sit to supine: Total assist, +2 for physical assistance   General bed mobility comments: max multi-modal cues for sequencing    Transfers Overall transfer level: Needs assistance Equipment used: Rolling walker (2 wheels) Transfers: Sit to/from Stand Sit to Stand: Mod assist, From elevated surface, +2 physical assistance           General transfer comment: Mod verbal cues for sequencing for WB compliance    Ambulation/Gait Ambulation/Gait assistance: Min assist Gait Distance (Feet): 1 Feet Assistive device: Rolling walker (2 wheels) Gait Pattern/deviations: Step-to pattern, Decreased stance time - right, Antalgic Gait velocity: decreased     General Gait Details: Max verbal and visual cues for proper sequencing for WB compliance with pt only able to take 2-3 very small, effortful steps at the EOB  Stairs            Wheelchair Mobility    Modified Rankin (Stroke Patients Only)       Balance Overall balance assessment: Needs assistance   Sitting balance-Leahy Scale: Normal     Standing balance support: Bilateral upper extremity supported, During functional activity Standing balance-Leahy Scale: Fair                               Pertinent Vitals/Pain Pain Assessment Pain Assessment: 0-10 Pain Score:  10-Worst pain ever Pain Location: R hip Pain Descriptors / Indicators: Aching, Sore Pain Intervention(s): Repositioned, Premedicated before session, Monitored during session, Ice applied    Home Living Family/patient expects to be discharged to:: Private residence Living  Arrangements: Alone Available Help at Discharge: Family;Available PRN/intermittently Type of Home: House Home Access: Stairs to enter Entrance Stairs-Rails: Right;Left;Can reach both Entrance Stairs-Number of Steps: 3   Home Layout: One level Home Equipment: None Additional Comments: Brother available to check in on him occasionally but no consistant assistance available at home    Prior Function Prior Level of Function : Independent/Modified Independent             Mobility Comments: Ind amb community distances without an AD, no other fall history, 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 ADLs Comments: Ind with ADLs     Hand Dominance        Extremity/Trunk Assessment   Upper Extremity Assessment Upper Extremity Assessment: Overall WFL for tasks assessed    Lower Extremity Assessment Lower Extremity Assessment: Generalized weakness;RLE deficits/detail RLE: Unable to fully assess due to pain       Communication   Communication: HOH  Cognition Arousal/Alertness: Awake/alert Behavior During Therapy: WFL for tasks assessed/performed Overall Cognitive Status: Within Functional Limits for tasks assessed                                          General Comments      Exercises Total Joint Exercises Long Arc Quad: AROM, Strengthening, Both, 10 reps Knee Flexion: AROM, Strengthening, Both, 10 reps   Assessment/Plan    PT Assessment Patient needs continued PT services  PT Problem List Decreased strength;Decreased activity tolerance;Decreased balance;Decreased mobility;Decreased knowledge of use of DME;Decreased knowledge of precautions;Pain       PT Treatment Interventions DME instruction;Gait training;Stair training;Functional mobility training;Therapeutic activities;Therapeutic exercise;Balance training;Patient/family education    PT Goals (Current goals can be found in the Care Plan section)  Acute Rehab PT Goals Patient Stated Goal: To be able to walk  again PT Goal Formulation: With patient Time For Goal Achievement: 05/24/22 Potential to Achieve Goals: Good    Frequency BID     Co-evaluation               AM-PAC PT "6 Clicks" Mobility  Outcome Measure Help needed turning from your back to your side while in a flat bed without using bedrails?: Total Help needed moving from lying on your back to sitting on the side of a flat bed without using bedrails?: Total Help needed moving to and from a bed to a chair (including a wheelchair)?: Total Help needed standing up from a chair using your arms (e.g., wheelchair or bedside chair)?: A Lot Help needed to walk in hospital room?: Total Help needed climbing 3-5 steps with a railing? : Total 6 Click Score: 7    End of Session Equipment Utilized During Treatment: Gait belt Activity Tolerance: Patient limited by pain Patient left: in bed;with call bell/phone within reach;with bed alarm set;with SCD's reapplied Nurse Communication: Mobility status PT Visit Diagnosis: Unsteadiness on feet (R26.81);Other abnormalities of gait and mobility (R26.89);Muscle weakness (generalized) (M62.81);Pain Pain - Right/Left: Right Pain - part of body: Hip    Time: 1610-9604 PT Time Calculation (min) (ACUTE ONLY): 29 min   Charges:   PT Evaluation $PT Eval Moderate Complexity: 1 Mod     D. Scott Tien Aispuro PT, DPT  05/11/22, 11:21 AM

## 2022-05-11 NOTE — TOC Progression Note (Signed)
Transition of Care [facility]) - Progression Note    Patient Details  Name: Casey Kemp MRN: [id number] Date of Birth: Feb 04, 1975  Transition of Care [facility]) CM/SW Contact  Marlowe Sax, RN Phone Number: 05/11/2022, 3:32 PM  Clinical Narrative:    Met with the patient and he is agreeable to a Bedsearch, PASSR obtained, Fl2 completed Bedsearch sent   Expected Discharge Plan: Skilled Nursing Facility Barriers to Discharge: SNF Pending bed offer, Insurance Authorization  Expected Discharge Plan and Services   Discharge Planning Services: CM Consult   Living arrangements for the past 2 months: Single Family Home                                       Social Determinants of Health (SDOH) Interventions SDOH Screenings   Food Insecurity: No Food Insecurity (05/09/2022)  Housing: Low Risk  (05/09/2022)  Transportation Needs: No Transportation Needs (05/09/2022)  Utilities: Not At Risk (05/09/2022)  Depression (PHQ2-9): Low Risk  (09/26/2020)  Tobacco Use: High Risk (05/11/2022)    Readmission Risk Interventions     No data to display

## 2022-05-11 NOTE — NC FL2 (Signed)
[place] MEDICAID FL2 LEVEL OF CARE FORM     IDENTIFICATION  Patient Name: Casey Kemp Birthdate: [birth year] Sex: male Admission Date (Current Location): 05/09/2022  [facility] and [place] Number:  Chiropodist and Address:         Provider Number: [id number]  Attending Physician Name and Address:  Darlin Priestly, MD  Relative Name and Phone Number:  Bettey Mare [phone]    Current Level of Care: Hospital Recommended Level of Care: Skilled Nursing Facility Prior Approval Number:    Date Approved/Denied:   PASRR Number: [id number]  Discharge Plan: SNF    Current Diagnoses: Patient Active Problem List   Diagnosis Date Noted   Closed right hip fracture 05/09/2022   Fall at home, initial encounter 05/09/2022   Alcohol use 05/09/2022   Hypertension 10/03/2020   Erythrocytosis 10/03/2020   Acute deep vein thrombosis (DVT) of popliteal vein of right lower extremity 10/03/2020   Tobacco abuse 10/03/2020    Orientation RESPIRATION BLADDER Height & Weight     Self, Time, Situation, Place  Normal Continent Weight:   Height:     BEHAVIORAL SYMPTOMS/MOOD NEUROLOGICAL BOWEL NUTRITION STATUS      Continent Diet (regular)  AMBULATORY STATUS COMMUNICATION OF NEEDS Skin   Extensive Assist   Normal, Surgical wounds                       Personal Care Assistance Level of Assistance  Bathing, Feeding, Dressing Bathing Assistance: Limited assistance Feeding assistance: Independent Dressing Assistance: Limited assistance     Functional Limitations Info  Sight, Hearing, Speech Sight Info: Adequate Hearing Info: Adequate Speech Info: Adequate    SPECIAL CARE FACTORS FREQUENCY  PT (By licensed PT), OT (By licensed OT)     PT Frequency: 5 times per week OT Frequency: 5 times per week            Contractures Contractures Info: Not present    Additional Factors Info  Allergies, Code Status Code Status Info: full Allergies Info: NKDA            Current Medications (05/11/2022):  This is the current hospital active medication list Current Facility-Administered Medications  Medication Dose Route Frequency Provider Last Rate Last Admin   acetaminophen (TYLENOL) tablet 325-650 mg  325-650 mg Oral Q6H PRN Deeann Saint, MD       acetaminophen (TYLENOL) tablet 650 mg  650 mg Oral Q6H PRN Deeann Saint, MD   650 mg at 05/11/22 1037   albuterol (PROVENTIL) (2.5 MG/3ML) 0.083% nebulizer solution 2.5 mg  2.5 mg Inhalation Q4H PRN Deeann Saint, MD       alum & mag hydroxide-simeth (MAALOX/MYLANTA) 200-200-20 MG/5ML suspension 30 mL  30 mL Oral Q4H PRN Deeann Saint, MD       bisacodyl (DULCOLAX) suppository 10 mg  10 mg Rectal Daily PRN Deeann Saint, MD       dextromethorphan-guaiFENesin [facility] DM) 30-600 MG per 12 hr tablet 1 tablet  1 tablet Oral BID PRN Deeann Saint, MD       enoxaparin (LOVENOX) injection 30 mg  30 mg Subcutaneous Q24H Deeann Saint, MD   30 mg at 05/11/22 6295   ferrous sulfate tablet 325 mg  325 mg Oral Q breakfast Deeann Saint, MD   325 mg at 05/11/22 0755   folic acid (FOLVITE) tablet 1 mg  1 mg Oral Daily Deeann Saint, MD   1 mg at 05/11/22 1038   gabapentin (NEURONTIN) capsule 400 mg  400 mg Oral BID Deeann Saint, MD   400 mg at 05/11/22 1038   hydrALAZINE (APRESOLINE) injection 5 mg  5 mg Intravenous Q2H PRN Deeann Saint, MD       lidocaine (LIDODERM) 5 % 1 patch  1 patch Transdermal Q24H Deeann Saint, MD   1 patch at 05/10/22 1513   lisinopril (ZESTRIL) tablet 10 mg  10 mg Oral Daily Deeann Saint, MD   10 mg at 05/11/22 1038   LORazepam (ATIVAN) tablet 1-4 mg  1-4 mg Oral Q1H PRN Deeann Saint, MD       Or   LORazepam (ATIVAN) injection 1-4 mg  1-4 mg Intravenous Q1H PRN Deeann Saint, MD       LORazepam (ATIVAN) tablet 0-4 mg  0-4 mg Oral Q6H Deeann Saint, MD   1 mg at 05/11/22 1241   Followed by   LORazepam (ATIVAN) tablet 0-4 mg  0-4 mg Oral Q12H Deeann Saint, MD        magnesium hydroxide (MILK OF MAGNESIA) suspension 30 mL  30 mL Oral Daily PRN Deeann Saint, MD       meloxicam [facility]) tablet 15 mg  15 mg Oral Daily Deeann Saint, MD   15 mg at 05/11/22 1037   menthol-cetylpyridinium (CEPACOL) lozenge 3 mg  1 lozenge Oral PRN Deeann Saint, MD       Or   phenol (CHLORASEPTIC) mouth spray 1 spray  1 spray Mouth/Throat PRN Deeann Saint, MD       methocarbamol (ROBAXIN) tablet 500 mg  500 mg Oral Q8H PRN Deeann Saint, MD   500 mg at 05/11/22 0755   metoCLOPramide (REGLAN) tablet 5-10 mg  5-10 mg Oral Q8H PRN Deeann Saint, MD       Or   metoCLOPramide (REGLAN) injection 5-10 mg  5-10 mg Intravenous Q8H PRN Deeann Saint, MD       multivitamin with minerals tablet 1 tablet  1 tablet Oral Daily Deeann Saint, MD   1 tablet at 05/11/22 1037   mupirocin ointment (BACTROBAN) 2 % 1 Application  1 Application Nasal BID Deeann Saint, MD   1 Application at 05/11/22 1039   nicotine (NICODERM CQ - dosed in mg/24 hours) patch 21 mg  21 mg Transdermal Daily Deeann Saint, MD   21 mg at 05/11/22 1038   ondansetron (ZOFRAN) injection 4 mg  4 mg Intravenous Q8H PRN Deeann Saint, MD       ondansetron [facility]) tablet 4 mg  4 mg Oral Q6H PRN Deeann Saint, MD       Or   ondansetron [facility]) injection 4 mg  4 mg Intravenous Q6H PRN Deeann Saint, MD       oxyCODONE-acetaminophen (PERCOCET/ROXICET) 5-325 MG per tablet 1-2 tablet  1-2 tablet Oral Q4H PRN Darlin Priestly, MD   2 tablet at 05/11/22 1357   senna (SENOKOT) tablet 8.6 mg  1 tablet Oral BID Deeann Saint, MD   8.6 mg at 05/11/22 1038   senna-docusate (Senokot-S) tablet 1 tablet  1 tablet Oral QHS PRN Deeann Saint, MD       sodium phosphate (FLEET) 7-19 GM/118ML enema 1 enema  1 enema Rectal Once PRN Deeann Saint, MD       thiamine (VITAMIN B1) tablet 100 mg  100 mg Oral Daily Deeann Saint, MD   100 mg at 05/11/22 1038   Or   thiamine (VITAMIN B1) injection 100 mg  100 mg Intravenous Daily Deeann Saint,  MD  Discharge Medications: Please see discharge summary for a list of discharge medications.  Relevant Imaging Results:  Relevant Lab Results:   Additional Information SS# [id number]  Marlowe Sax, RN

## 2022-05-12 DIAGNOSIS — S72001A Fracture of unspecified part of neck of right femur, initial encounter for closed fracture: Secondary | ICD-10-CM | POA: Diagnosis not present

## 2022-05-12 LAB — MAGNESIUM: Magnesium: 1.7 mg/dL (ref 1.7–2.4)

## 2022-05-12 LAB — CBC
HCT: 39.9 % (ref 39.0–52.0)
Hemoglobin: 13.8 g/dL (ref 13.0–17.0)
MCH: 37.5 pg — ABNORMAL HIGH (ref 26.0–34.0)
MCHC: 34.6 g/dL (ref 30.0–36.0)
MCV: 108.4 fL — ABNORMAL HIGH (ref 80.0–100.0)
Platelets: 247 10*3/uL (ref 150–400)
RBC: 3.68 MIL/uL — ABNORMAL LOW (ref 4.22–5.81)
RDW: 12.8 % (ref 11.5–15.5)
WBC: 17.4 10*3/uL — ABNORMAL HIGH (ref 4.0–10.5)
nRBC: 0 % (ref 0.0–0.2)

## 2022-05-12 LAB — BASIC METABOLIC PANEL
Anion gap: 9 (ref 5–15)
BUN: 11 mg/dL (ref 6–20)
CO2: 25 mmol/L (ref 22–32)
Calcium: 8.1 mg/dL — ABNORMAL LOW (ref 8.9–10.3)
Chloride: 97 mmol/L — ABNORMAL LOW (ref 98–111)
Creatinine, Ser: 0.92 mg/dL (ref 0.61–1.24)
GFR, Estimated: >60 mL/min (ref >60)
Glucose, Bld: 109 mg/dL — ABNORMAL HIGH (ref 70–99)
Potassium: 3.7 mmol/L (ref 3.5–5.1)
Sodium: 131 mmol/L — ABNORMAL LOW (ref 135–145)

## 2022-05-12 LAB — PROCALCITONIN: Procalcitonin: 38.6 ng/mL

## 2022-05-12 NOTE — TOC Progression Note (Signed)
Transition of Care [facility]) - Progression Note    Patient Details  Name: Casey Kemp MRN: [id number] Date of Birth: November 02, 1975  Transition of Care [facility]) CM/SW Contact  Marlowe Sax, RN Phone Number: 05/12/2022, 11:48 AM  Clinical Narrative:     There are no bed offers at this time, sent out in expanded area across the entire HUB  Expected Discharge Plan: Skilled Nursing Facility Barriers to Discharge: SNF Pending bed offer, Insurance Authorization  Expected Discharge Plan and Services   Discharge Planning Services: CM Consult   Living arrangements for the past 2 months: Single Family Home                                       Social Determinants of Health (SDOH) Interventions SDOH Screenings   Food Insecurity: No Food Insecurity (05/09/2022)  Housing: Low Risk  (05/09/2022)  Transportation Needs: No Transportation Needs (05/09/2022)  Utilities: Not At Risk (05/09/2022)  Depression (PHQ2-9): Low Risk  (09/26/2020)  Tobacco Use: High Risk (05/11/2022)    Readmission Risk Interventions     No data to display

## 2022-05-12 NOTE — Progress Notes (Signed)
Physical Therapy Treatment Patient Details Name: Casey Kemp MRN: [id number] DOB: [birth year] Today's Date: 05/12/2022   History of Present Illness Pt is a 47 yo M diagnosed with comminuted, displaced, intertrochanteric/base neck fracture of the right hip secondary to a fall and is now s/p IM nail.  PMH includes HTN, HOH, tobacco abuse, and alcohol use.    PT Comments    Pt sitting EOB upon arrival.  Stated he as been up walking x 3 in room this AM on his own.  He is able to stand to RW with min guard/assist x 1 with generally unsteady transition.  He is able to walk 10' in room with heavy use of RW but does seem to maintian 25% WB well   Fatigued with effort and declined further mobility at this time.   Pt is educated to call for staff assist and is educated on risks of walking unassisted.  Voiced understanding.  Bed alarm put on after session.  Discussed discharge plan.  Stated he lives alone and does not feel his home is wheelchair accessible and would only be appropriate for a walker.  He stated he does not have much assist if any for home.   Recommendations for follow up therapy are one component of a multi-disciplinary discharge planning process, led by the attending physician.  Recommendations may be updated based on patient status, additional functional criteria and insurance authorization.  Follow Up Recommendations  Can patient physically be transported by private vehicle: No    Assistance Recommended at Discharge Frequent or constant Supervision/Assistance  Patient can return home with the following Two people to help with walking and/or transfers;A lot of help with bathing/dressing/bathroom;Assistance with cooking/housework;Assist for transportation;Help with stairs or ramp for entrance   Equipment Recommendations  Other (comment) (TBD)    Recommendations for Other Services       Precautions / Restrictions Precautions Precautions: Fall Restrictions Weight Bearing  Restrictions: Yes RLE Weight Bearing: Partial weight bearing RLE Partial Weight Bearing Percentage or Pounds: 25%     Mobility  Bed Mobility               General bed mobility comments: sitting EOB and remained so after session Patient Response: Cooperative  Transfers Overall transfer level: Needs assistance Equipment used: Rolling walker (2 wheels) Transfers: Sit to/from Stand Sit to Stand: Min guard, Min assist           General transfer comment: generally unsteady upon but does manage on his own.    Ambulation/Gait Ambulation/Gait assistance: Min guard, Min assist Gait Distance (Feet): 10 Feet Assistive device: Rolling walker (2 wheels) Gait Pattern/deviations: Step-to pattern, Decreased stance time - right, Antalgic Gait velocity: decreased     General Gait Details: generally unsteady but does well with 25% WB status.  effortful gait with heavy use of UE's   Stairs             Wheelchair Mobility    Modified Rankin (Stroke Patients Only)       Balance Overall balance assessment: Needs assistance Sitting-balance support: Feet supported Sitting balance-Leahy Scale: Normal     Standing balance support: Bilateral upper extremity supported, During functional activity, Reliant on assistive device for balance Standing balance-Leahy Scale: Fair                              Cognition Arousal/Alertness: Awake/alert Behavior During Therapy: WFL for tasks assessed/performed Overall Cognitive Status: Within Functional Limits for  tasks assessed                                          Exercises Other Exercises Other Exercises: declined further interventions at this time    General Comments        Pertinent Vitals/Pain Pain Assessment Pain Assessment: Faces Faces Pain Scale: Hurts even more Pain Location: R hip Pain Descriptors / Indicators: Aching, Sore Pain Intervention(s): Repositioned, Monitored during  session    Home Living                          Prior Function            PT Goals (current goals can now be found in the care plan section) Progress towards PT goals: Progressing toward goals    Frequency    BID      PT Plan Current plan remains appropriate    Co-evaluation              AM-PAC PT "6 Clicks" Mobility   Outcome Measure  Help needed turning from your back to your side while in a flat bed without using bedrails?: None Help needed moving from lying on your back to sitting on the side of a flat bed without using bedrails?: None Help needed moving to and from a bed to a chair (including a wheelchair)?: A Little Help needed standing up from a chair using your arms (e.g., wheelchair or bedside chair)?: A Little Help needed to walk in hospital room?: A Little Help needed climbing 3-5 steps with a railing? : A Lot 6 Click Score: 19    End of Session Equipment Utilized During Treatment: Gait belt Activity Tolerance: Patient limited by pain Patient left: in chair;with call bell/phone within reach;with chair alarm set;with SCD's reapplied Nurse Communication: Mobility status;Weight bearing status PT Visit Diagnosis: Unsteadiness on feet (R26.81);Other abnormalities of gait and mobility (R26.89);Muscle weakness (generalized) (M62.81);Pain Pain - Right/Left: Right Pain - part of body: Hip     Time: 0981-1914 PT Time Calculation (min) (ACUTE ONLY): 10 min  Charges:  $Gait Training: 8-22 mins                   Danielle Dess, PTA 05/12/22, 10:05 AM

## 2022-05-12 NOTE — Progress Notes (Signed)
Pt refused bed alarm but was educated about its importance. Will contineu to monitor.

## 2022-05-12 NOTE — Progress Notes (Signed)
Physical Therapy Treatment Patient Details Name: Casey Kemp MRN: [id number] DOB: November 08, 1975 Today's Date: 05/12/2022   History of Present Illness Pt is a 47 yo M diagnosed with comminuted, displaced, intertrochanteric/base neck fracture of the right hip secondary to a fall and is now s/p IM nail.  PMH includes HTN, HOH, tobacco abuse, and alcohol use.    PT Comments    Pt sitting EOB.  Stated he just sat down from walking around bed with walker to get remote that fell on the floor.  Stated he sat in chair to pick it up.  Again educated on calling staff for assist with mobility.  Bed alarm was off and it seems pt may be turning it off on his own.  He does agree to gait and self selects distance just outside of door and turns back to chair.  Cues for gait sequencing which does improve gait quality and safety.  Declined further interventions.  Discussed plan.  Pt is progressing with mobility.  He does not have any bed offers at this time per [facility] notes.  Discussed again home and ways to manage at home if SNF is not an option for him.  He as stairs into home with no rails.  Will address tomorrow if needed.   Recommendations for follow up therapy are one component of a multi-disciplinary discharge planning process, led by the attending physician.  Recommendations may be updated based on patient status, additional functional criteria and insurance authorization.  Follow Up Recommendations  Can patient physically be transported by private vehicle: No    Assistance Recommended at Discharge Frequent or constant Supervision/Assistance  Patient can return home with the following Two people to help with walking and/or transfers;A lot of help with bathing/dressing/bathroom;Assistance with cooking/housework;Assist for transportation;Help with stairs or ramp for entrance   Equipment Recommendations  Other (comment) (TBD)    Recommendations for Other Services       Precautions / Restrictions  Precautions Precautions: Fall Restrictions Weight Bearing Restrictions: Yes RLE Weight Bearing: Partial weight bearing RLE Partial Weight Bearing Percentage or Pounds: 25%     Mobility  Bed Mobility               General bed mobility comments: sitting EOB and remained so after session Patient Response: Cooperative  Transfers Overall transfer level: Needs assistance Equipment used: Rolling walker (2 wheels) Transfers: Sit to/from Stand Sit to Stand: Min guard           General transfer comment: improved this session    Ambulation/Gait Ambulation/Gait assistance: Min guard Gait Distance (Feet): 30 Feet Assistive device: Rolling walker (2 wheels) Gait Pattern/deviations: Step-to pattern, Decreased stance time - right, Antalgic Gait velocity: decreased     General Gait Details: generally unsteady but does well with 25% WB status.  effortful gait with heavy use of UE's   Stairs             Wheelchair Mobility    Modified Rankin (Stroke Patients Only)       Balance Overall balance assessment: Needs assistance Sitting-balance support: Feet supported Sitting balance-Leahy Scale: Normal     Standing balance support: Bilateral upper extremity supported, During functional activity, Reliant on assistive device for balance Standing balance-Leahy Scale: Fair                              Cognition Arousal/Alertness: Awake/alert Behavior During Therapy: WFL for tasks assessed/performed Overall Cognitive Status: Within Functional  Limits for tasks assessed                                          Exercises Other Exercises Other Exercises: declined further interventions at this time    General Comments        Pertinent Vitals/Pain Pain Assessment Pain Assessment: Faces Faces Pain Scale: Hurts little more Pain Location: R hip Pain Descriptors / Indicators: Aching, Sore Pain Intervention(s): Limited activity within  patient's tolerance, Repositioned    Home Living                          Prior Function            PT Goals (current goals can now be found in the care plan section) Progress towards PT goals: Progressing toward goals    Frequency    BID      PT Plan Current plan remains appropriate    Co-evaluation              AM-PAC PT "6 Clicks" Mobility   Outcome Measure  Help needed turning from your back to your side while in a flat bed without using bedrails?: None Help needed moving from lying on your back to sitting on the side of a flat bed without using bedrails?: None Help needed moving to and from a bed to a chair (including a wheelchair)?: A Little Help needed standing up from a chair using your arms (e.g., wheelchair or bedside chair)?: A Little Help needed to walk in hospital room?: A Little Help needed climbing 3-5 steps with a railing? : A Lot 6 Click Score: 19    End of Session Equipment Utilized During Treatment: Gait belt Activity Tolerance: Patient limited by pain Patient left: in chair;with call bell/phone within reach;with chair alarm set;with SCD's reapplied Nurse Communication: Mobility status;Weight bearing status PT Visit Diagnosis: Unsteadiness on feet (R26.81);Other abnormalities of gait and mobility (R26.89);Muscle weakness (generalized) (M62.81);Pain Pain - Right/Left: Right Pain - part of body: Hip     Time: 1250-1300 PT Time Calculation (min) (ACUTE ONLY): 10 min  Charges:  $Gait Training: 8-22 mins                    Danielle Dess, PTA 05/12/22, 1:14 PM

## 2022-05-12 NOTE — Progress Notes (Signed)
  PROGRESS NOTE    Casey Kemp  ZOX:[id number] DOB: 1975/08/26 DOA: 05/09/2022 PCP: Duanne Limerick, MD  [id number]  LOS: 3 days   Brief hospital course:   Assessment & Plan: Casey Kemp is a 47 y.o. male with medical history significant of hypertension, DVT (off anticoagulants), erythrocytosis, tobacco abuse, alcohol use, chronic hearing loss and HOH, who presented with fall and right hip pain.    Closed right hip fracture:  S/p right INTRAMEDULLARY (IM) NAIL INTERTROCHANTERIC on 05/10/22 X-ray showed acute right intertrochanteric femur fracture.   --PT rec SNF rehab --discharge on 81 mg ASA twice daily for 6 weeks --should remain 25% weightbearing on the right leg. --return to Dr. Hyacinth Meeker office in 2 weeks for exam and staple removal. --f/u with PCP for osteoporosis workup   Fall at home, initial encounter:   Hypertension: --cont Lisinopril   Erythrocytosis:  This is chronic issue. Pt was seen by Dr. Cathie Hoops of hematology.  Per her note on 10/17/2020, patient had negative JAK2 V617F mutation negative, with reflex to other mutations CALR, MPL, JAK 2 Ex 12-15 mutations negative. BCR ABL1 FISH was negative. It is possibly due to secondary erythrocytosis since patient had elevated elevated carbon monoxide level.  Likely secondary erythrocytosis due to smoking. Per Dr. Bethanne Ginger note, pt needs phlebotomy if hematocrit is above 50. Today his Hgb is 18.3 and HCT is 53.0. Since pt will need surgery, will not do phlebotomy due to possible blood loss.  After surgery, recheck CBC to determine if patient need phlebotomy.  Due to the erythrocytosis, patient has increased risk of developing blood clot.   -need to f/u with hematologist   Tobacco abuse and alcohol use:  No signs of alcohol withdrawal now. -admitting physician did counseling about importance of quitting smoking and cutting down the drinking -Nicotine patch -CIWA protocol    DVT prophylaxis: Lovenox SQ Code Status: Full  code  Family Communication:  Level of care: Med-Surg Dispo:   The patient is from: home Anticipated d/c is to: SNF rehab Anticipated d/c date is: whenever bed available   Subjective and Interval History:  Pt worked with PT today.   Objective: Vitals:   05/12/22 0902 05/12/22 1208 05/12/22 1655 05/12/22 1733  BP: 130/72 138/85 112/79 97/62  Pulse: 94 90 (!) 113 99  Resp: Temp: (!) 97.3 F (36.3 C)  99.9 F (37.7 C) 99.9 F (37.7 C)  TempSrc:      SpO2: 98%  98% 96%    Intake/Output Summary (Last 24 hours) at 05/12/2022 1907 Last data filed at 05/12/2022 1554 Gross per 24 hour  Intake 720 ml  Output 850 ml  Net -130 ml   There were no vitals filed for this visit.  Examination:   Constitutional: NAD, AAOx3 HEENT: conjunctivae and lids normal, EOMI CV: No cyanosis.   RESP: normal respiratory effort, on RA Neuro: II - XII grossly intact.   Psych: Normal mood and affect.  Appropriate judgement and reason   Data Reviewed: I have personally reviewed labs and imaging studies  Time spent: 25 minutes  Darlin Priestly, MD [facility] If 7PM-7AM, please contact night-coverage 05/12/2022, 7:07 PM

## 2022-05-12 NOTE — Plan of Care (Signed)
  Problem: Education: Goal: Knowledge of General Education information will improve Description: Including pain rating scale, medication(s)/side effects and non-pharmacologic comfort measures Outcome: Progressing   Problem: Clinical Measurements: Goal: Respiratory complications will improve Outcome: Progressing   Problem: Clinical Measurements: Goal: Cardiovascular complication will be avoided Outcome: Progressing   Problem: Activity: Goal: Risk for activity intolerance will decrease Outcome: Progressing   Problem: Nutrition: Goal: Adequate nutrition will be maintained Outcome: Progressing   Problem: Elimination: Goal: Will not experience complications related to bowel motility Outcome: Progressing   Problem: Elimination: Goal: Will not experience complications related to urinary retention Outcome: Progressing   Problem: Pain Managment: Goal: General experience of comfort will improve Outcome: Progressing   Problem: Safety: Goal: Ability to remain free from injury will improve Outcome: Progressing

## 2022-05-13 DIAGNOSIS — I10 Essential (primary) hypertension: Secondary | ICD-10-CM | POA: Diagnosis not present

## 2022-05-13 DIAGNOSIS — F1010 Alcohol abuse, uncomplicated: Secondary | ICD-10-CM | POA: Diagnosis not present

## 2022-05-13 DIAGNOSIS — S72001A Fracture of unspecified part of neck of right femur, initial encounter for closed fracture: Secondary | ICD-10-CM | POA: Diagnosis not present

## 2022-05-13 LAB — BASIC METABOLIC PANEL
Anion gap: 7 (ref 5–15)
BUN: 13 mg/dL (ref 6–20)
CO2: 24 mmol/L (ref 22–32)
Calcium: 8.2 mg/dL — ABNORMAL LOW (ref 8.9–10.3)
Chloride: 99 mmol/L (ref 98–111)
Creatinine, Ser: 0.73 mg/dL (ref 0.61–1.24)
GFR, Estimated: >60 mL/min (ref >60)
Glucose, Bld: 102 mg/dL — ABNORMAL HIGH (ref 70–99)
Potassium: 3.7 mmol/L (ref 3.5–5.1)
Sodium: 130 mmol/L — ABNORMAL LOW (ref 135–145)

## 2022-05-13 LAB — CBC
HCT: 36 % — ABNORMAL LOW (ref 39.0–52.0)
Hemoglobin: 12.5 g/dL — ABNORMAL LOW (ref 13.0–17.0)
MCH: 37.1 pg — ABNORMAL HIGH (ref 26.0–34.0)
MCHC: 34.7 g/dL (ref 30.0–36.0)
MCV: 106.8 fL — ABNORMAL HIGH (ref 80.0–100.0)
Platelets: 237 10*3/uL (ref 150–400)
RBC: 3.37 MIL/uL — ABNORMAL LOW (ref 4.22–5.81)
RDW: 12.6 % (ref 11.5–15.5)
WBC: 11.2 10*3/uL — ABNORMAL HIGH (ref 4.0–10.5)
nRBC: 0 % (ref 0.0–0.2)

## 2022-05-13 LAB — MAGNESIUM: Magnesium: 1.9 mg/dL (ref 1.7–2.4)

## 2022-05-13 NOTE — Progress Notes (Signed)
Physical Therapy Treatment Patient Details Name: Casey Kemp MRN: [id number] DOB: Jul 31, 1975 Today's Date: 05/13/2022   History of Present Illness Pt is a 47 yo M diagnosed with comminuted, displaced, intertrochanteric/base neck fracture of the right hip secondary to a fall and is now s/p IM nail.  PMH includes HTN, HOH, tobacco abuse, and alcohol use.    PT Comments    Pt was long sitting in bed upon arrival. He is A and O x 4 but is HOH. Pt does well adhering to [facility] restrictions however pt is limited by pain. Endorses 6/10 pain at rest that elevates to 9/10 with activity. Pt was able to ambulate short distances with RW. Session focused on stair training. Pt was able to ascend/descend 4 stair on bottom however required Mod assist to stand from floor surfaces afterwards. He was unwilling to attempt stairs going backwards with using RW due to fear. Acute PT will continue to follow and progress as able per current POC. Will return this afternoon to progress pt towards all acute PT goals. DC recs remain appropriate due to no assistance available at home + [facility] restrictions/safe ability to perform stairs I'ly.     Recommendations for follow up therapy are one component of a multi-disciplinary discharge planning process, led by the attending physician.  Recommendations may be updated based on patient status, additional functional criteria and insurance authorization.     Assistance Recommended at Discharge Frequent or constant Supervision/Assistance  Patient can return home with the following A lot of help with walking and/or transfers;A lot of help with bathing/dressing/bathroom;Assist for transportation;Help with stairs or ramp for entrance;Assistance with cooking/housework   Equipment Recommendations  Other (comment) (Defer to next level of care)       Precautions / Restrictions Precautions Precautions: Fall Restrictions Weight Bearing Restrictions: Yes RLE Weight Bearing: Partial  weight bearing RLE Partial Weight Bearing Percentage or Pounds: 25     Mobility  Bed Mobility Overal bed mobility: Needs Assistance Bed Mobility: Supine to Sit, Sit to Supine  Supine to sit: Supervision Sit to supine: Supervision   Transfers Overall transfer level: Needs assistance Equipment used: Rolling walker (2 wheels) Transfers: Sit to/from Stand Sit to Stand: Min guard    Ambulation/Gait Ambulation/Gait assistance: Min guard Gait Distance (Feet): 30 Feet Assistive device: Rolling walker (2 wheels) Gait Pattern/deviations: Step-to pattern, Decreased stance time - right, Antalgic Gait velocity: decreased    General Gait Details: Generally unsteady but does well with 25% WB status.  effortful gait with heavy use of UE's   Stairs Stairs: Yes Stairs assistance: Mod assist Stair Management: No rails, Backwards, Seated/boosting Number of Stairs: 4 General stair comments: Pt was able to ascend/descend 4 stair however required mod assist to stand from floor surface while maintaining PWB 25%. pt unwilling to attempt step to backwards technique   Balance Overall balance assessment: Needs assistance Sitting-balance support: Feet supported Sitting balance-Leahy Scale: Normal     Standing balance support: Bilateral upper extremity supported, During functional activity, Reliant on assistive device for balance Standing balance-Leahy Scale: Fair       Cognition Arousal/Alertness: Awake/alert Behavior During Therapy: WFL for tasks assessed/performed Overall Cognitive Status: Within Functional Limits for tasks assessed      General Comments: Pt is A and O x 4.  HOH           General Comments General comments (skin integrity, edema, etc.): Reviewed the importance of performing previously issued HEP to promote return in strength and increase circulation for  healing      Pertinent Vitals/Pain Pain Assessment Pain Assessment: 0-10 Pain Score: 6  Pain Location: R hip Pain  Descriptors / Indicators: Aching, Sore Pain Intervention(s): Limited activity within patient's tolerance, Monitored during session, Premedicated before session, Repositioned, Ice applied     PT Goals (current goals can now be found in the care plan section) Acute Rehab PT Goals Patient Stated Goal: Get better so I can go home and get back to work Progress towards PT goals: Progressing toward goals    Frequency    BID      PT Plan Current plan remains appropriate       AM-PAC PT "6 Clicks" Mobility   Outcome Measure  Help needed turning from your back to your side while in a flat bed without using bedrails?: None Help needed moving from lying on your back to sitting on the side of a flat bed without using bedrails?: None Help needed moving to and from a bed to a chair (including a wheelchair)?: A Little Help needed standing up from a chair using your arms (e.g., wheelchair or bedside chair)?: A Little Help needed to walk in hospital room?: A Little Help needed climbing 3-5 steps with a railing? : A Lot 6 Click Score: 19    End of Session   Activity Tolerance: Patient tolerated treatment well;Patient limited by pain Patient left: in bed;with call bell/phone within reach;with bed alarm set Nurse Communication: Mobility status;Weight bearing status PT Visit Diagnosis: Unsteadiness on feet (R26.81);Other abnormalities of gait and mobility (R26.89);Muscle weakness (generalized) (M62.81);Pain Pain - Right/Left: Right Pain - part of body: Hip     Time: 1610-9604 PT Time Calculation (min) (ACUTE ONLY): 24 min  Charges:  $Gait Training: 8-22 mins $Therapeutic Activity: 8-22 mins                     Jetta Lout PTA 05/13/22, 12:12 PM

## 2022-05-13 NOTE — Plan of Care (Signed)

## 2022-05-13 NOTE — Progress Notes (Signed)
Subjective: 3 Days Post-Op Procedure(s) (LRB): INTRAMEDULLARY (IM) NAIL INTERTROCHANTERIC (Right) The patient has done well.  Pain is controlled.  He is making progress with physical therapy.  Since he lives alone rehab stay and may be indicated.  He should follow-up in my office in 2 weeks.  He may be touchdown weightbearing on the right.  He and I again discussed the severity of his fracture and the fact that of the femoral head may not survive and may need replacement at a later date.  Patient reports pain as mild.  Objective:   VITALS:   Vitals:   05/13/22 0538 05/13/22 1025  BP: 106/66 119/68  Pulse: 85 79  Resp: 20 18  Temp: 98.6 F (37 C) 98.1 F (36.7 C)  SpO2: 95% 98%    Neurologically intact Incision: dressing C/D/I  LABS Recent Labs    05/11/22 0457 05/12/22 0621 05/13/22 0515  HGB 13.5 13.8 12.5*  HCT 38.8* 39.9 36.0*  WBC 15.0* 17.4* 11.2*  PLT 213 247 237    Recent Labs    05/11/22 0457 05/12/22 0621 05/13/22 0515  NA 133* 131* 130*  K 4.0 3.7 3.7  BUN CREATININE 0.82 0.92 0.73  GLUCOSE 105* 109* 102*    No results for input(s): "LABPT", "INR" in the last 72 hours.   Assessment/Plan: 3 Days Post-Op Procedure(s) (LRB): INTRAMEDULLARY (IM) NAIL INTERTROCHANTERIC (Right)   Up with therapy Discharge to SNF as needed. Touchdown weightbearing right leg if possible. Return to clinic 2 weeks for exam and x-ray.

## 2022-05-13 NOTE — Progress Notes (Signed)
PROGRESS NOTE    Casey Kemp  ZOX:[id number] DOB: 1975-03-17 DOA: 05/09/2022 PCP: Duanne Limerick, MD   Assessment & Plan:   Principal Problem:   Closed right hip fracture Active Problems:   Fall at home, initial encounter   Hypertension   Erythrocytosis   Tobacco abuse   Alcohol use  Assessment and Plan: Closed right hip fracture: s/p right intramedullary nail intertrochanteric on 05/10/22. PT recs SNF. Continue on aspirin BID x 6 weeks as per ortho surg. Remain 25% weightbearing on the right leg. F/u w/ ortho surg, Dr. Hyacinth Meeker office in 2 weeks for exam and staple removal.   Fall: at home. Fall precautions    HTN: continue on lisinopril    Erythrocytosis: chronic. Per Dr. Bethanne Ginger note, pt needs phlebotomy if hematocrit is above 50. Due to the erythrocytosis, patient has increased risk of developing blood clot.  F/u outpatient w/ heme  Tobacco abuse: nicotine patch to prevent w/drawal. Received smoking cessation counseling already  Alcohol abuse: no signs/symptoms of w/drawal. Received alcohol cessation counseling already       DVT prophylaxis: lovenox  Code Status: full  Family Communication: Disposition Plan: likely d/c to SNF   Level of care: Med-Surg Status is: Inpatient Remains inpatient appropriate because: needs SNF placement    Consultants:  Ortho surg   Procedures:  Antimicrobials:   Subjective: Pt c/o malaise   Objective: Vitals:   05/12/22 1655 05/12/22 1733 05/12/22 2354 05/13/22 0538  BP: 112/79 97/62 109/74 106/66  Pulse: (!) 113 99 90 85  Resp: Temp: 99.9 F (37.7 C) 99.9 F (37.7 C) 99.7 F (37.6 C) 98.6 F (37 C)  TempSrc:    Oral  SpO2: 98% 96% 97% 95%    Intake/Output Summary (Last 24 hours) at 05/13/2022 0909 Last data filed at 05/13/2022 0409 Gross per 24 hour  Intake 540 ml  Output 1525 ml  Net -985 ml   There were no vitals filed for this visit.  Examination:  General exam: Appears calm and  comfortable  Respiratory system: Clear to auscultation. Respiratory effort normal. Cardiovascular system: S1 & S2 +. No rubs, gallops or clicks.  Gastrointestinal system: Abdomen is nondistended, soft and nontender. Normal bowel sounds heard. Central nervous system: Alert and oriented. Moves all extremities Psychiatry: Judgement and insight appear normal. Flat mood and affect      Data Reviewed: I have personally reviewed following labs and imaging studies  CBC: Recent Labs  Lab 05/09/22 1417 05/10/22 0439 05/11/22 0457 05/12/22 0621 05/13/22 0515  WBC 8.8 11.9* 15.0* 17.4* 11.2*  NEUTROABS 4.9  --   --   --   --   HGB 18.2* 14.3 13.5 13.8 12.5*  HCT 53.0* 41.7 38.8* 39.9 36.0*  MCV 108.8* 108.9* 109.3* 108.4* 106.8*  PLT 267 224 213 247 237   Basic Metabolic Panel: Recent Labs  Lab 05/09/22 1417 05/10/22 0439 05/11/22 0457 05/12/22 0621 05/13/22 0515  NA 138 136 133* 131* 130*  K 4.7 3.6 4.0 3.7 3.7  CL 107 105 99 97* 99  CO2 GLUCOSE 109* 121* 105* 109* 102*  BUN CREATININE 0.77 0.75 0.82 0.92 0.73  CALCIUM 8.3* 8.0* 8.1* 8.1* 8.2*  MG  --   --  1.8 1.7 1.9   GFR: CrCl cannot be calculated (Unknown ideal weight.). Liver Function Tests: Recent Labs  Lab 05/09/22 1417  AST 41  ALT 18  ALKPHOS  85  BILITOT 1.0  PROT 7.2  ALBUMIN 3.9   No results for input(s): "LIPASE", "AMYLASE" in the last 168 hours. No results for input(s): "AMMONIA" in the last 168 hours. Coagulation Profile: Recent Labs  Lab 05/09/22 1515  INR 1.0   Cardiac Enzymes: No results for input(s): "CKTOTAL", "CKMB", "CKMBINDEX", "TROPONINI" in the last 168 hours. BNP (last 3 results) No results for input(s): "PROBNP" in the last 8760 hours. HbA1C: No results for input(s): "HGBA1C" in the last 72 hours. CBG: No results for input(s): "GLUCAP" in the last 168 hours. Lipid Profile: No results for input(s): "CHOL", "HDL", "LDLCALC", "TRIG", "CHOLHDL",  "LDLDIRECT" in the last 72 hours. Thyroid Function Tests: No results for input(s): "TSH", "T4TOTAL", "FREET4", "T3FREE", "THYROIDAB" in the last 72 hours. Anemia Panel: No results for input(s): "VITAMINB12", "FOLATE", "FERRITIN", "TIBC", "IRON", "RETICCTPCT" in the last 72 hours. Sepsis Labs: Recent Labs  Lab 05/12/22 0621  PROCALCITON 38.60    Recent Results (from the past 240 hour(s))  Surgical PCR screen     Status: Abnormal   Collection Time: 05/09/22  7:00 PM   Specimen: Nasal Mucosa; Nasal Swab  Result Value Ref Range Status   MRSA, PCR NEGATIVE NEGATIVE Final   Staphylococcus aureus POSITIVE (A) NEGATIVE Final    Comment: (NOTE) The Xpert SA Assay (FDA approved for NASAL specimens in patients 30 years of age and older), is one component of a comprehensive surveillance program. It is not intended to diagnose infection nor to guide or monitor treatment. Performed at [facility], [address], [place], [place] [address]          Radiology Studies: No results found.      Scheduled Meds:  enoxaparin (LOVENOX) injection  30 mg Subcutaneous Q24H   ferrous sulfate  325 mg Oral Q breakfast   folic acid  1 mg Oral Daily   gabapentin  400 mg Oral BID   lidocaine  1 patch Transdermal Q24H   lisinopril  10 mg Oral Daily   LORazepam  0-4 mg Oral Q12H   meloxicam  15 mg Oral Daily   multivitamin with minerals  1 tablet Oral Daily   mupirocin ointment  1 Application Nasal BID   nicotine  21 mg Transdermal Daily   senna  1 tablet Oral BID   thiamine  100 mg Oral Daily   Or   thiamine  100 mg Intravenous Daily   Continuous Infusions:   LOS: 4 days    Time spent: 25 mins     Charise Killian, MD [facility] Pager [phone]  If 7PM-7AM, please contact night-coverage [email or web address] 05/13/2022, 9:09 AM

## 2022-05-13 NOTE — TOC Progression Note (Signed)
Transition of Care [facility]) - Progression Note    Patient Details  Name: Casey Kemp MRN: [id number] Date of Birth: [birth year]  Transition [facility]) CM/SW Contact  Marlowe Sax, RN Phone Number: 05/13/2022, 2:18 PM  Clinical Narrative:   Reviewed the bed offers with the patient, I explained we still have to get Ins approval, He chose [facility], I notified Baird Lyons at [facility] and requested they get Ins approval    Expected Discharge Plan: Skilled Nursing Facility Barriers to Discharge: SNF Pending bed offer, Insurance Authorization  Expected Discharge Plan and Services   Discharge Planning Services: CM Consult   Living arrangements for the past 2 months: Single Family Home                                       Social Determinants of Health (SDOH) Interventions SDOH Screenings   Food Insecurity: No Food Insecurity (05/09/2022)  Housing: Low Risk  (05/09/2022)  Transportation Needs: No Transportation Needs (05/09/2022)  Utilities: Not At Risk (05/09/2022)  Depression (PHQ2-9): Low Risk  (09/26/2020)  Tobacco Use: High Risk (05/11/2022)    Readmission Risk Interventions     No data to display

## 2022-05-13 NOTE — Progress Notes (Signed)
Physical Therapy Treatment Patient Details Name: Casey Kemp MRN: [id number] DOB: [birth year] Today's Date: 05/13/2022   History of Present Illness Pt is a 47 yo M diagnosed with comminuted, displaced, intertrochanteric/base neck fracture of the right hip secondary to a fall and is now s/p IM nail.  PMH includes HTN, HOH, tobacco abuse, and alcohol use.    PT Comments    Author returned for BID/2nd session. Pt was ambulating back form BR and requested not to leave room this session. Session focused on strengthening. Chartered loss adjuster issued HEP handout and pt performed with assistance. Pain continues to limit pt's progression. He has chosen STR at [facility]. See exercises performed listed below.     Recommendations for follow up therapy are one component of a multi-disciplinary discharge planning process, led by the attending physician.  Recommendations may be updated based on patient status, additional functional criteria and insurance authorization.     Assistance Recommended at Discharge Frequent or constant Supervision/Assistance  Patient can return home with the following A little help with walking and/or transfers;A little help with bathing/dressing/bathroom;Assist for transportation;Help with stairs or ramp for entrance   Equipment Recommendations  Other (comment) (Defer to next level of care)       Precautions / Restrictions Precautions Precautions: Fall Restrictions Weight Bearing Restrictions: Yes RLE Weight Bearing: Partial weight bearing RLE Partial Weight Bearing Percentage or Pounds: 25     Mobility  Bed Mobility Overal bed mobility: Needs Assistance Bed Mobility: Supine to Sit, Sit to Supine  Supine to sit: Supervision Sit to supine: Supervision     Transfers Overall transfer level: Needs assistance Equipment used: Rolling walker (2 wheels) Transfers: Sit to/from Stand Sit to Stand: Min guard    General transfer comment: CGA for safetty. still requires VCs for  handplacement and technique improvements    Ambulation/Gait Ambulation/Gait assistance: Min guard Gait Distance (Feet): 20 Feet Assistive device: Rolling walker (2 wheels) Gait Pattern/deviations: Step-to pattern, Decreased stance time - right, Antalgic Gait velocity: decreased   General Gait Details: Generally unsteady but does well with 25% WB status.  Effortful gait with heavy use of UE's   Stairs Stairs: Yes Stairs assistance: Mod assist Stair Management: No rails, Backwards, Seated/boosting Number of Stairs: 4 General stair comments: Pt was able to ascend/descend 4 stair however required mod assist to stand from floor surface while maintaining PWB 25%. pt unwilling to attempt step to backwards technique    Balance Overall balance assessment: Needs assistance Sitting-balance support: Feet supported Sitting balance-Leahy Scale: Normal     Standing balance support: Bilateral upper extremity supported, During functional activity, Reliant on assistive device for balance Standing balance-Leahy Scale: Fair          Cognition Arousal/Alertness: Awake/alert Behavior During Therapy: WFL for tasks assessed/performed Overall Cognitive Status: Within Functional Limits for tasks assessed    General Comments: Pt is A and O x 4.  [facility]        Exercises General Exercises - Lower Extremity Ankle Circles/Pumps: AROM, 10 reps Quad Sets: AROM, 10 reps Gluteal Sets: 10 reps Heel Slides: AAROM, 10 reps Hip ABduction/ADduction: AAROM, 10 reps Straight Leg Raises: AAROM, 10 reps    General Comments General comments (skin integrity, edema, etc.): Reviewed the importance of performing previously issued HEP to promote return in strength and increase circulation for healing      Pertinent Vitals/Pain Pain Assessment Pain Assessment: 0-10 Pain Score: 7  Pain Location: R hip Pain Descriptors / Indicators: Aching, Sore Pain Intervention(s): Limited activity within  patient's tolerance,  Monitored during session, Premedicated before session, Repositioned     PT Goals (current goals can now be found in the care plan section) Acute Rehab PT Goals Patient Stated Goal: rehab then home Progress towards PT goals: Progressing toward goals    Frequency    BID      PT Plan Current plan remains appropriate       AM-PAC PT "6 Clicks" Mobility   Outcome Measure  Help needed turning from your back to your side while in a flat bed without using bedrails?: None Help needed moving from lying on your back to sitting on the side of a flat bed without using bedrails?: None Help needed moving to and from a bed to a chair (including a wheelchair)?: A Little Help needed standing up from a chair using your arms (e.g., wheelchair or bedside chair)?: A Little Help needed to walk in hospital room?: A Little Help needed climbing 3-5 steps with a railing? : A Lot 6 Click Score: 19    End of Session   Activity Tolerance: Patient tolerated treatment well;Patient limited by pain Patient left: in bed;with call bell/phone within reach;with bed alarm set Nurse Communication: Mobility status;Weight bearing status PT Visit Diagnosis: Unsteadiness on feet (R26.81);Other abnormalities of gait and mobility (R26.89);Muscle weakness (generalized) (M62.81);Pain Pain - Right/Left: Right Pain - part of body: Hip     Time: 1421-1440 PT Time Calculation (min) (ACUTE ONLY): 19 min  Charges:  $Gait Training: 8-22 mins $Therapeutic Exercise: 8-22 mins $Therapeutic Activity: 8-22 mins                     Jetta Lout PTA 05/13/22, 3:17 PM

## 2022-05-14 DIAGNOSIS — S72001A Fracture of unspecified part of neck of right femur, initial encounter for closed fracture: Secondary | ICD-10-CM | POA: Diagnosis not present

## 2022-05-14 DIAGNOSIS — F1010 Alcohol abuse, uncomplicated: Secondary | ICD-10-CM | POA: Diagnosis not present

## 2022-05-14 DIAGNOSIS — I10 Essential (primary) hypertension: Secondary | ICD-10-CM | POA: Diagnosis not present

## 2022-05-14 LAB — BASIC METABOLIC PANEL
Anion gap: 8 (ref 5–15)
BUN: 12 mg/dL (ref 6–20)
CO2: 26 mmol/L (ref 22–32)
Calcium: 8.2 mg/dL — ABNORMAL LOW (ref 8.9–10.3)
Chloride: 100 mmol/L (ref 98–111)
Creatinine, Ser: 0.79 mg/dL (ref 0.61–1.24)
GFR, Estimated: >60 mL/min (ref >60)
Glucose, Bld: 105 mg/dL — ABNORMAL HIGH (ref 70–99)
Potassium: 3.7 mmol/L (ref 3.5–5.1)
Sodium: 134 mmol/L — ABNORMAL LOW (ref 135–145)

## 2022-05-14 LAB — CBC
HCT: 35.1 % — ABNORMAL LOW (ref 39.0–52.0)
Hemoglobin: 12 g/dL — ABNORMAL LOW (ref 13.0–17.0)
MCH: 36.9 pg — ABNORMAL HIGH (ref 26.0–34.0)
MCHC: 34.2 g/dL (ref 30.0–36.0)
MCV: 108 fL — ABNORMAL HIGH (ref 80.0–100.0)
Platelets: 283 10*3/uL (ref 150–400)
RBC: 3.25 MIL/uL — ABNORMAL LOW (ref 4.22–5.81)
RDW: 12.4 % (ref 11.5–15.5)
WBC: 11.2 10*3/uL — ABNORMAL HIGH (ref 4.0–10.5)
nRBC: 0 % (ref 0.0–0.2)

## 2022-05-14 LAB — MAGNESIUM: Magnesium: 2.1 mg/dL (ref 1.7–2.4)

## 2022-05-14 LAB — FOLATE: Folate: 11.9 ng/mL (ref >5.9)

## 2022-05-14 LAB — VITAMIN B12: Vitamin B-12: 119 pg/mL — ABNORMAL LOW (ref 180–914)

## 2022-05-14 NOTE — Progress Notes (Signed)
PROGRESS NOTE    Casey Kemp  ZOX:[id number] DOB: 1975/05/25 DOA: 05/09/2022 PCP: Duanne Limerick, MD   Assessment & Plan:   Principal Problem:   Closed right hip fracture Active Problems:   Fall at home, initial encounter   Hypertension   Erythrocytosis   Tobacco abuse   Alcohol use  Assessment and Plan: Closed right hip fracture: s/p right intramedullary nail intertrochanteric on 05/10/22. PT recs SNF. Continue on aspirin BID x 6 weeks as per ortho surg. 25% weightbearing on the right leg. F/u w/ ortho surg (Dr. Hyacinth Meeker) in 2 weeks for exam & staple removal    Fall: at home. Fall precautions    HTN: continue on lisinopril    Erythrocytosis: chronic, Hct < 50. Per Dr. Bethanne Ginger note, pt needs phlebotomy if hematocrit is above 50. Due to the erythrocytosis, patient has increased risk of developing blood clot.  Will need outpatient f/u w/ heme   Tobacco abuse: nicotine patch to prevent w/drawal. Received smoking cessation counseling already  Alcohol abuse: no signs/symptoms of w/drawal. Received alcohol cessation counseling already       DVT prophylaxis: lovenox  Code Status: full  Family Communication: Disposition Plan: likely d/c to SNF   Level of care: Med-Surg Status is: Inpatient Remains inpatient appropriate because: waiting on insurance auth as per CM    Consultants:  Ortho surg   Procedures:  Antimicrobials:   Subjective: Pt c/o fatigue   Objective: Vitals:   05/13/22 0538 05/13/22 1025 05/13/22 2336 05/13/22 2339  BP: 106/66 119/68 97/62 (!) 97/57  Pulse: 85 79 77 78  Resp: Temp: 98.6 F (37 C) 98.1 F (36.7 C) 98.9 F (37.2 C) 98.9 F (37.2 C)  TempSrc: Oral     SpO2: 95% 98% 94% 93%    Intake/Output Summary (Last 24 hours) at 05/14/2022 0801 Last data filed at 05/14/2022 0549 Gross per 24 hour  Intake 600 ml  Output 2025 ml  Net -1425 ml   There were no vitals filed for this visit.  Examination:  General exam:  Appears comfortable  Respiratory system: clear breath sounds b/l  Cardiovascular system: S1/S2+. No rubs or clicks  Gastrointestinal system: Abd is soft, NT, ND & normal bowel sounds  Central nervous system: Alert and oriented. Moves all extremities  Psychiatry: judgement and insight appears normal. Appropriate mood and affect     Data Reviewed: I have personally reviewed following labs and imaging studies  CBC: Recent Labs  Lab 05/09/22 1417 05/10/22 0439 05/11/22 0457 05/12/22 0621 05/13/22 0515 05/14/22 0452  WBC 8.8 11.9* 15.0* 17.4* 11.2* 11.2*  NEUTROABS 4.9  --   --   --   --   --   HGB 18.2* 14.3 13.5 13.8 12.5* 12.0*  HCT 53.0* 41.7 38.8* 39.9 36.0* 35.1*  MCV 108.8* 108.9* 109.3* 108.4* 106.8* 108.0*  PLT 267 224 213 247 237 283   Basic Metabolic Panel: Recent Labs  Lab 05/10/22 0439 05/11/22 0457 05/12/22 0621 05/13/22 0515 05/14/22 0452  NA 136 133* 131* 130* 134*  K 3.6 4.0 3.7 3.7 3.7  CL 105 99 97* 99 100  CO2 GLUCOSE 121* 105* 109* 102* 105*  BUN CREATININE 0.75 0.82 0.92 0.73 0.79  CALCIUM 8.0* 8.1* 8.1* 8.2* 8.2*  MG  --  1.8 1.7 1.9 2.1   GFR: CrCl cannot be calculated (Unknown ideal weight.). Liver Function Tests: Recent Labs  Lab  05/09/22 1417  AST 41  ALT 18  ALKPHOS 85  BILITOT 1.0  PROT 7.2  ALBUMIN 3.9   No results for input(s): "LIPASE", "AMYLASE" in the last 168 hours. No results for input(s): "AMMONIA" in the last 168 hours. Coagulation Profile: Recent Labs  Lab 05/09/22 1515  INR 1.0   Cardiac Enzymes: No results for input(s): "CKTOTAL", "CKMB", "CKMBINDEX", "TROPONINI" in the last 168 hours. BNP (last 3 results) No results for input(s): "PROBNP" in the last 8760 hours. HbA1C: No results for input(s): "HGBA1C" in the last 72 hours. CBG: No results for input(s): "GLUCAP" in the last 168 hours. Lipid Profile: No results for input(s): "CHOL", "HDL", "LDLCALC", "TRIG", "CHOLHDL",  "LDLDIRECT" in the last 72 hours. Thyroid Function Tests: No results for input(s): "TSH", "T4TOTAL", "FREET4", "T3FREE", "THYROIDAB" in the last 72 hours. Anemia Panel: Recent Labs    05/14/22 0452  FOLATE 11.9   Sepsis Labs: Recent Labs  Lab 05/12/22 0621  PROCALCITON 38.60    Recent Results (from the past 240 hour(s))  Surgical PCR screen     Status: Abnormal   Collection Time: 05/09/22  7:00 PM   Specimen: Nasal Mucosa; Nasal Swab  Result Value Ref Range Status   MRSA, PCR NEGATIVE NEGATIVE Final   Staphylococcus aureus POSITIVE (A) NEGATIVE Final    Comment: (NOTE) The Xpert SA Assay (FDA approved for NASAL specimens in patients 2 years of age and older), is one component of a comprehensive surveillance program. It is not intended to diagnose infection nor to guide or monitor treatment. Performed at [facility], [address] [place], [place] [address]          Radiology Studies: No results found.      Scheduled Meds:  enoxaparin (LOVENOX) injection  30 mg Subcutaneous Q24H   ferrous sulfate  325 mg Oral Q breakfast   folic acid  1 mg Oral Daily   lidocaine  1 patch Transdermal Q24H   lisinopril  10 mg Oral Daily   meloxicam  15 mg Oral Daily   multivitamin with minerals  1 tablet Oral Daily   mupirocin ointment  1 Application Nasal BID   nicotine  21 mg Transdermal Daily   senna  1 tablet Oral BID   thiamine  100 mg Oral Daily   Or   thiamine  100 mg Intravenous Daily   Continuous Infusions:   LOS: 5 days    Time spent: 25 mins     Charise Killian, MD [facility] Pager [phone]  If 7PM-7AM, please contact night-coverage [email or web address] 05/14/2022, 8:01 AM

## 2022-05-14 NOTE — Plan of Care (Signed)

## 2022-05-14 NOTE — Progress Notes (Signed)
Physical Therapy Treatment Patient Details Name: Casey Kemp MRN: [id number] DOB: 1975-08-20 Today's Date: 05/14/2022   History of Present Illness Pt is a 47 yo M diagnosed with comminuted, displaced, intertrochanteric/base neck fracture of the right hip secondary to a fall and is now s/p IM nail.  PMH includes HTN, HOH, tobacco abuse, and alcohol use.    PT Comments    Pt was long sitting in bed upon arrival. He agrees to session and is cooperative and motivated throughout. Session focused on improving gait tolerance while adhering to [facility] restrictions. He was able to advance gait distances to 70 ft without LOB./ does endorse slightly increased pain but overall endorses feeling"ok". Once returned to room, pt was able to correctly demonstrate several exercises I'ly. Acute PT will continue to follow per current POC. Pt planning to DC to STR tomorrow now that insurance [employer] has been confirmed.    Recommendations for follow up therapy are one component of a multi-disciplinary discharge planning process, led by the attending physician.  Recommendations may be updated based on patient status, additional functional criteria and insurance authorization.     Assistance Recommended at Discharge Frequent or constant Supervision/Assistance  Patient can return home with the following A little help with walking and/or transfers;A little help with bathing/dressing/bathroom;Assist for transportation;Help with stairs or ramp for entrance   Equipment Recommendations  Other (comment)       Precautions / Restrictions Precautions Precautions: Fall Restrictions Weight Bearing Restrictions: Yes RLE Weight Bearing: Partial weight bearing RLE Partial Weight Bearing Percentage or Pounds: 25%     Mobility  Bed Mobility Overal bed mobility: Modified Independent Bed Mobility: Supine to Sit, Sit to Supine  Supine to sit: Modified independent (Device/Increase time) Sit to supine: Modified independent  (Device/Increase time) General bed mobility comments: pt was able to exit bed and then re-enmter bed without physical assistance or Vcs. performed from flat bed surface    Transfers Overall transfer level: Needs assistance Equipment used: Rolling walker (2 wheels) Transfers: Sit to/from Stand Sit to Stand: Supervision  General transfer comment: CGA for safety with vcs fopr improved technqiue to limit wt bearing off operative LE    Ambulation/Gait Ambulation/Gait assistance: Supervision Gait Distance (Feet): 70 Feet Assistive device: Rolling walker (2 wheels) Gait Pattern/deviations: Step-to pattern, Decreased stance time - right, Antalgic Gait velocity: decreased   General Gait Details: Pt was able to ambulate ~ 42' with RW with vcs for increased wt bearing on UEs versus LEs. He does well limiting wt off operative LE    Balance Overall balance assessment: Needs assistance Sitting-balance support: Feet supported Sitting balance-Leahy Scale: Normal     Standing balance support: Bilateral upper extremity supported, During functional activity, Reliant on assistive device for balance Standing balance-Leahy Scale: Fair Standing balance comment: Is at high fall risk due to wt bearing restrictions and poor overall safety awareness       Cognition Arousal/Alertness: Awake/alert Behavior During Therapy: WFL for tasks assessed/performed Overall Cognitive Status: Within Functional Limits for tasks assessed      General Comments: Pt is A and O x 4.  [facility]        Exercises General Exercises - Lower Extremity Ankle Circles/Pumps: AROM, 10 reps Quad Sets: AROM, 10 reps Gluteal Sets: 10 reps Heel Slides: AAROM, 10 reps Hip ABduction/ADduction: AAROM, 10 reps Straight Leg Raises: AAROM, 10 reps        Pertinent Vitals/Pain Pain Assessment Pain Assessment: 0-10 Pain Score: 7  Pain Intervention(s): Limited activity within patient's  tolerance, Monitored during session, Premedicated  before session, Repositioned     PT Goals (current goals can now be found in the care plan section) Acute Rehab PT Goals Patient Stated Goal: rehab then home Progress towards PT goals: Progressing toward goals    Frequency    BID      PT Plan Current plan remains appropriate       AM-PAC PT "6 Clicks" Mobility   Outcome Measure  Help needed turning from your back to your side while in a flat bed without using bedrails?: None Help needed moving from lying on your back to sitting on the side of a flat bed without using bedrails?: None Help needed moving to and from a bed to a chair (including a wheelchair)?: A Little Help needed standing up from a chair using your arms (e.g., wheelchair or bedside chair)?: A Little Help needed to walk in hospital room?: A Little Help needed climbing 3-5 steps with a railing? : A Lot 6 Click Score: 19    End of Session   Activity Tolerance: Patient tolerated treatment well;Patient limited by pain Patient left: in bed;with call bell/phone within reach;with bed alarm set Nurse Communication: Mobility status;Weight bearing status PT Visit Diagnosis: Unsteadiness on feet (R26.81);Other abnormalities of gait and mobility (R26.89);Muscle weakness (generalized) (M62.81);Pain Pain - Right/Left: Right Pain - part of body: Hip     [id number] PT Time Calculation (min) (ACUTE ONLY): 21 min  Charges:  $Gait Training: 8-22 mins $Therapeutic Activity: 8-22 mins                     Jetta Lout PTA 05/14/22, 4:16 PM

## 2022-05-14 NOTE — TOC Progression Note (Signed)
Transition of Care [facility]) - Progression Note    Patient Details  Name: Casey Kemp MRN: [id number] Date of Birth: Feb 10, 1975  [facility] CM/SW Contact  Marlowe Sax, RN Phone Number: 05/14/2022, 2:25 PM  Clinical Narrative:    Ins approved, can DC to [place] [facility] tomorrow   Expected Discharge Plan: Skilled Nursing Facility Barriers to Discharge: SNF Pending bed offer, Insurance Authorization  Expected Discharge Plan and Services   Discharge Planning Services: CM Consult   Living arrangements for the past 2 months: Single Family Home                                       Social Determinants of Health (SDOH) Interventions SDOH Screenings   Food Insecurity: No Food Insecurity (05/09/2022)  Housing: Low Risk  (05/09/2022)  Transportation Needs: No Transportation Needs (05/09/2022)  Utilities: Not At Risk (05/09/2022)  Depression (PHQ2-9): Low Risk  (09/26/2020)  Tobacco Use: High Risk (05/11/2022)    Readmission Risk Interventions     No data to display

## 2022-05-14 NOTE — TOC Progression Note (Signed)
Transition of Care [facility]) - Progression Note    Patient Details  Name: Casey Kemp MRN: [id number] Date of Birth: 1975-08-17  Transition of Care [facility]) CM/SW Contact  Marlowe Sax, RN Phone Number: 05/14/2022, 10:12 AM  Clinical Narrative:     Reached out to check Ins status for the patient to go to [place], Ins pending  Expected Discharge Plan: Skilled Nursing Facility Barriers to Discharge: SNF Pending bed offer, Insurance Authorization  Expected Discharge Plan and Services   Discharge Planning Services: CM Consult   Living arrangements for the past 2 months: Single Family Home                                       Social Determinants of Health (SDOH) Interventions SDOH Screenings   Food Insecurity: No Food Insecurity (05/09/2022)  Housing: Low Risk  (05/09/2022)  Transportation Needs: No Transportation Needs (05/09/2022)  Utilities: Not At Risk (05/09/2022)  Depression (PHQ2-9): Low Risk  (09/26/2020)  Tobacco Use: High Risk (05/11/2022)    Readmission Risk Interventions     No data to display

## 2022-05-14 NOTE — Progress Notes (Signed)
Subjective: 4 Days Post-Op Procedure(s) (LRB): INTRAMEDULLARY (IM) NAIL INTERTROCHANTERIC (Right) Patient is feeling some better today.  He is participating in PT.  He is awaiting a rehab bed.  Some drainage on his dressing which the nurses asked me to check.  Patient reports pain as mild.  Objective:   VITALS:   Vitals:   05/14/22 0848 05/14/22 1602  BP: 104/73 112/70  Pulse: 82 88  Resp: 17 17  Temp: 98.2 F (36.8 C) 98.1 F (36.7 C)  SpO2: 95% 94%    Neurologically intact Incision: moderate drainage Some soft edema in the thigh and leg.  LABS Recent Labs    05/12/22 0621 05/13/22 0515 05/14/22 0452  HGB 13.8 12.5* 12.0*  HCT 39.9 36.0* 35.1*  WBC 17.4* 11.2* 11.2*  PLT 247 237 283    Recent Labs    05/12/22 0621 05/13/22 0515 05/14/22 0452  NA 131* 130* 134*  K 3.7 3.7 3.7  BUN CREATININE 0.92 0.73 0.79  GLUCOSE 109* 102* 105*    No results for input(s): "LABPT", "INR" in the last 72 hours.   Assessment/Plan: 4 Days Post-Op Procedure(s) (LRB): INTRAMEDULLARY (IM) NAIL INTERTROCHANTERIC (Right)   Up with therapy Discharge to SNF Hold aspirin this afternoon and tomorrow morning. Dressings were changed.

## 2022-05-14 NOTE — Progress Notes (Signed)
Physical Therapy Treatment Patient Details Name: Casey Kemp MRN: [id number] DOB: Jun 15, 1975 Today's Date: 05/14/2022   History of Present Illness Pt is a 47 yo M diagnosed with comminuted, displaced, intertrochanteric/base neck fracture of the right hip secondary to a fall and is now s/p IM nail.  PMH includes HTN, HOH, tobacco abuse, and alcohol use.    PT Comments    Pt required some encouragement to participate but once agreeable, puts forth good effort. He continues to demonstrate progressing safety and abilities to exit bed, stand, and ambulate. Pt does well with PWB but needs vcs occasionally as reminders. Pt endorses he has been performing previously issued HEP but did still require assistance with ~ 1/2 of the exercises. Pt is having a lot of drainage from aquaseal dressing. RN is aware and will address. Current recs remain appropriate due to pt's wt bearing restrictions and limited assistance available at DC.     Recommendations for follow up therapy are one component of a multi-disciplinary discharge planning process, led by the attending physician.  Recommendations may be updated based on patient status, additional functional criteria and insurance authorization.     Assistance Recommended at Discharge Frequent or constant Supervision/Assistance  Patient can return home with the following A little help with walking and/or transfers;A little help with bathing/dressing/bathroom;Assist for transportation;Help with stairs or ramp for entrance   Equipment Recommendations  Other (comment) (Defer to next level of care)       Precautions / Restrictions Precautions Precautions: Fall Restrictions Weight Bearing Restrictions: Yes RLE Weight Bearing: Partial weight bearing RLE Partial Weight Bearing Percentage or Pounds: 25%     Mobility  Bed Mobility Overal bed mobility: Modified Independent Bed Mobility: Supine to Sit, Sit to Supine  Supine to sit: Modified independent  (Device/Increase time) Sit to supine: Modified independent (Device/Increase time) General bed mobility comments: pt was able to exit bed and then re-enmter bed without physical assistance or Vcs. performed from flat bed surface    Transfers Overall transfer level: Needs assistance Equipment used: Rolling walker (2 wheels) Transfers: Sit to/from Stand Sit to Stand: Min guard, Supervision   General transfer comment: CGA for safety with vcs fopr improved technqiue to limit wt bearing off operative LE    Ambulation/Gait Ambulation/Gait assistance: Min guard Gait Distance (Feet): 30 Feet Assistive device: Rolling walker (2 wheels) Gait Pattern/deviations: Step-to pattern, Decreased stance time - right, Antalgic Gait velocity: decreased  General Gait Details: Pt continues to fatigue quickly but is able to ambulate to BR and return without seated rest. Will progress gait distnaces in PM session. continues to demonstrate ability to properly maintain PWB with occasional Vc for reminder    Balance Overall balance assessment: Needs assistance Sitting-balance support: Feet supported Sitting balance-Leahy Scale: Normal     Standing balance support: Bilateral upper extremity supported, During functional activity, Reliant on assistive device for balance Standing balance-Leahy Scale: Fair Standing balance comment: Is at high fall risk due to wt bearing restrictions and poor overall safety awareness       Cognition Arousal/Alertness: Awake/alert Behavior During Therapy: WFL for tasks assessed/performed Overall Cognitive Status: Within Functional Limits for tasks assessed      General Comments: Pt is A and O x 4.  [facility]        Exercises General Exercises - Lower Extremity Ankle Circles/Pumps: AROM, 10 reps Quad Sets: AROM, 10 reps Gluteal Sets: 10 reps Heel Slides: AAROM, 10 reps Hip ABduction/ADduction: AAROM, 10 reps Straight Leg Raises: AAROM, 10 reps  Pertinent Vitals/Pain  Pain Assessment Pain Assessment: 0-10 Pain Score: 7  Pain Intervention(s): Limited activity within patient's tolerance, Monitored during session, Premedicated before session, Repositioned     PT Goals (current goals can now be found in the care plan section) Acute Rehab PT Goals Patient Stated Goal: rehab then home Progress towards PT goals: Progressing toward goals    Frequency    BID      PT Plan Current plan remains appropriate       AM-PAC PT "6 Clicks" Mobility   Outcome Measure  Help needed turning from your back to your side while in a flat bed without using bedrails?: None Help needed moving from lying on your back to sitting on the side of a flat bed without using bedrails?: None Help needed moving to and from a bed to a chair (including a wheelchair)?: A Little Help needed standing up from a chair using your arms (e.g., wheelchair or bedside chair)?: A Little Help needed to walk in hospital room?: A Little Help needed climbing 3-5 steps with a railing? : A Lot 6 Click Score: 19    End of Session   Activity Tolerance: Patient tolerated treatment well;Patient limited by pain Patient left: in bed;with call bell/phone within reach;with bed alarm set Nurse Communication: Mobility status;Weight bearing status PT Visit Diagnosis: Unsteadiness on feet (R26.81);Other abnormalities of gait and mobility (R26.89);Muscle weakness (generalized) (M62.81);Pain Pain - Right/Left: Right Pain - part of body: Hip     Time: 1202-1220 PT Time Calculation (min) (ACUTE ONLY): 18 min  Charges:  $Therapeutic Activity: 8-22 mins                     Jetta Lout PTA 05/14/22, 12:55 PM

## 2022-05-15 DIAGNOSIS — D751 Secondary polycythemia: Secondary | ICD-10-CM | POA: Diagnosis not present

## 2022-05-15 DIAGNOSIS — S72001A Fracture of unspecified part of neck of right femur, initial encounter for closed fracture: Secondary | ICD-10-CM | POA: Diagnosis not present

## 2022-05-15 DIAGNOSIS — I10 Essential (primary) hypertension: Secondary | ICD-10-CM | POA: Diagnosis not present

## 2022-05-15 LAB — BASIC METABOLIC PANEL
Anion gap: 6 (ref 5–15)
BUN: 8 mg/dL (ref 6–20)
CO2: 24 mmol/L (ref 22–32)
Calcium: 8.2 mg/dL — ABNORMAL LOW (ref 8.9–10.3)
Chloride: 102 mmol/L (ref 98–111)
Creatinine, Ser: 0.61 mg/dL (ref 0.61–1.24)
GFR, Estimated: >60 mL/min (ref >60)
Glucose, Bld: 105 mg/dL — ABNORMAL HIGH (ref 70–99)
Potassium: 4 mmol/L (ref 3.5–5.1)
Sodium: 132 mmol/L — ABNORMAL LOW (ref 135–145)

## 2022-05-15 LAB — CBC
HCT: 34.2 % — ABNORMAL LOW (ref 39.0–52.0)
Hemoglobin: 11.7 g/dL — ABNORMAL LOW (ref 13.0–17.0)
MCH: 36.8 pg — ABNORMAL HIGH (ref 26.0–34.0)
MCHC: 34.2 g/dL (ref 30.0–36.0)
MCV: 107.5 fL — ABNORMAL HIGH (ref 80.0–100.0)
Platelets: 320 10*3/uL (ref 150–400)
RBC: 3.18 MIL/uL — ABNORMAL LOW (ref 4.22–5.81)
RDW: 12.4 % (ref 11.5–15.5)
WBC: 9.9 10*3/uL (ref 4.0–10.5)
nRBC: 0 % (ref 0.0–0.2)

## 2022-05-15 LAB — MAGNESIUM: Magnesium: 1.9 mg/dL (ref 1.7–2.4)

## 2022-05-15 MED ORDER — ASPIRIN 81 MG PO TBEC: 81.0000 mg | DELAYED_RELEASE_TABLET | Freq: Two times a day (BID) | ORAL | 0 refills | Status: AC

## 2022-05-15 MED ORDER — ASPIRIN 325 MG PO TABS
325.0000 mg | ORAL_TABLET | Freq: Two times a day (BID) | ORAL | Status: DC
  Administered 2022-05-15: 325 mg via ORAL
  Filled 2022-05-15: qty 1

## 2022-05-15 MED ORDER — OXYCODONE-ACETAMINOPHEN 5-325 MG PO TABS: 1.0000 | ORAL_TABLET | Freq: Four times a day (QID) | ORAL | 0 refills | Status: AC | PRN

## 2022-05-15 MED ORDER — ORAL CARE MOUTH RINSE: 15.0000 mL | OROMUCOSAL | Status: DC | PRN

## 2022-05-15 NOTE — Plan of Care (Signed)

## 2022-05-15 NOTE — Discharge Summary (Signed)
Physician Discharge Summary  Casey Kemp ZOX:[id number] DOB: [birth year] DOA: 05/09/2022  PCP: Duanne Limerick, MD  Admit date: 05/09/2022 Discharge date: 05/15/2022  Admitted From: home  Disposition:  SNF  Recommendations for Outpatient Follow-up:  Follow up with PCP in 1-2 weeks F/u w/ ortho surg (Dr. Hyacinth Meeker) in 2 weeks for exam & staple removal   Home Health: no  Equipment/Devices:  Discharge Condition: stable CODE STATUS: full  Diet recommendation: Heart Healthy  Brief/Interim Summary: HPI was taken from Dr. Clyde Lundborg: Casey Kemp is a 47 y.o. male with medical history significant of hypertension, DVT (off anticoagulants), erythrocytosis, tobacco abuse, alcohol use, chronic hearing loss and HOH, who presents with fall and right hip pain.   Pt states that he drank 3 beers in AM in his brother's home. He was walking and tripped over his shoes and fell on right hip in early afternoon today.  No loss of conscious.  Denies head or neck injury.  He developed pain in right hip, which is constant, sharp, severe, nonradiating.  No leg numbness.  Patient denies chest pain, cough, shortness breath.  No nausea, vomiting, diarrhea or abdominal pain.  No symptoms of UTI.  He states that his fall is simply an accident. He states that he was previously on Eliquis for right leg DVT due to  covid19 infection, but that was over a year ago, and denies being on any blood thinner for over a year.  Patient states that he drinks beer mainly in weekend Friday and Saturday, 6 packs each time.   Data reviewed independently and ED Course: pt was found to have WBC 8.8, GFR> 60, temperature normal, blood pressure 144/107, heart rate 95, 90, RR 16, oxygen saturation 100% on room air.  Chest x-ray negative for irritation, acute subacute healing left ninth rib fracture.  CT head negative.  CT of C-spine negative for acute injury, but showed emphysema of lung.  X-ray of right hip/pelvis showed right  intertrochanteric femur fracture.  Patient is admitted to MedSurg bed as inpatient.  Dr. Hyacinth Meeker of Ortho is consulted.    Discharge Diagnoses:  Principal Problem:   Closed right hip fracture Active Problems:   Fall at home, initial encounter   Hypertension   Erythrocytosis   Tobacco abuse   Alcohol use  Closed right hip fracture: s/p right intramedullary nail intertrochanteric on 05/10/22. PT recs SNF. Continue on aspirin BID x 6 weeks as per ortho surg. 25% weightbearing on the right leg as per ortho surg. F/u w/ ortho surg (Dr. Hyacinth Meeker) in 2 weeks for exam & staple removal    Fall: at home. Fall precautions    HTN: continue on lisinopril    Erythrocytosis: chronic, Hct < 50. Per Dr. Bethanne Ginger note, pt needs phlebotomy if hematocrit is above 50. Due to the erythrocytosis, patient has increased risk of developing blood clot.  Will need outpatient f/u w/ heme    Tobacco abuse: nicotine patch to prevent w/drawal. Received smoking cessation counseling already   Alcohol abuse: no signs/symptoms of w/drawal. Received alcohol cessation counseling already   Discharge Instructions  Discharge Instructions     Diet - low sodium heart healthy   Complete by: As directed    Discharge instructions   Complete by: As directed    F/u w/ ortho surg (Dr. Hyacinth Meeker) in 2 weeks for exam & staple removal. F/u w/ PCP in 1-2 weeks   Increase activity slowly   Complete by: As directed    No wound care  Complete by: As directed       Allergies as of 05/15/2022   No Known Allergies      Medication List     TAKE these medications    apixaban 5 MG Tabs tablet Commonly known as: Eliquis Take 1 tablet (5 mg total) by mouth 2 (two) times daily.   aspirin EC 81 MG tablet Take 1 tablet (81 mg total) by mouth 2 (two) times daily. Swallow whole.   cyanocobalamin 1000 MCG tablet Commonly known as: VITAMIN B12 Take 1 tablet (1,000 mcg total) by mouth daily.   lisinopril 10 MG tablet Commonly known  as: ZESTRIL Take 1 tablet (10 mg total) by mouth daily.   oxyCODONE-acetaminophen 5-325 MG tablet Commonly known as: PERCOCET/ROXICET Take 1-2 tablets by mouth every 6 (six) hours as needed for up to 1 day for moderate pain or severe pain.        Contact information for follow-up providers     Deeann Saint, MD Follow up in 2 week(s).   Specialty: Orthopedic Surgery Why: For suture removal, For wound re-check, For re-evaluation  Please make a follow-up appointment in my office for 2 weeks prior to his leaving the facility Contact information: [address] [place] [address] [phone]              Contact information for after-discharge care     Destination     [facility] Preferred SNF .   Service: Skilled Nursing Contact information: [address] [place] [address] [phone]                    No Known Allergies  Consultations: Ortho surg    Procedures/Studies: DG HIP UNILAT WITH PELVIS 2-3 VIEWS RIGHT  Result Date: 05/10/2022 CLINICAL DATA:  47 year old male undergoing right femur ORIF. EXAM: DG HIP (WITH OR WITHOUT PELVIS) 2-3V RIGHT COMPARISON:  Right hip series 05/09/2022. Fluoroscopy time: 1 minute 11 seconds FINDINGS: 3 intraoperative fluoroscopic spot views of the proximal right femur demonstrate placement of intramedullary rod with interlocking dynamic hip screw and distal interlocking cortical screw. These images suggest femoral neck fracture component, as well as the intertrochanteric component visible yesterday. IMPRESSION: ORIF proximal right femur fracture. Electronically Signed   By: Odessa Fleming M.D.   On: 05/10/2022 10:57   DG C-Arm 1-60 Min-No Report  Result Date: 05/10/2022 Fluoroscopy was utilized by the requesting physician.  No radiographic interpretation.   CT HEAD WO CONTRAST ( )  Result Date: 05/09/2022 CLINICAL DATA:  Head trauma, abnormal mental status (Age 3-64y); Ataxia,  cervical trauma EXAM: CT HEAD WITHOUT CONTRAST CT CERVICAL SPINE WITHOUT CONTRAST TECHNIQUE: Multidetector CT imaging of the head and cervical spine was performed following the standard protocol without intravenous contrast. Multiplanar CT image reconstructions of the cervical spine were also generated. RADIATION DOSE REDUCTION: This exam was performed according to the departmental dose-optimization program which includes automated exposure control, adjustment of the mA and/or kV according to patient size and/or use of iterative reconstruction technique. COMPARISON:  None Available. FINDINGS: CT HEAD FINDINGS Brain: No evidence of acute infarction, hemorrhage, hydrocephalus, extra-axial collection or mass lesion/mass effect. Vascular: No hyperdense vessel or unexpected calcification. Skull: Normal. Negative for fracture or focal lesion. Sinuses/Orbits: No acute finding. Other: None. CT CERVICAL SPINE FINDINGS Alignment: Facet joints are aligned without dislocation or traumatic listhesis. Dens and lateral masses are aligned. Skull base and vertebrae: No acute fracture. No primary bone lesion or focal pathologic process. Soft tissues and spinal  canal: No prevertebral fluid or swelling. No visible canal hematoma. Disc levels:  Within normal limits. Upper chest: Emphysematous changes at the lung apices. Other: None. IMPRESSION: 1. No acute intracranial findings. 2. No acute fracture or subluxation of the cervical spine. 3. Emphysematous changes at the lung apices (ICD10-J43.9). Electronically Signed   By: Duanne Guess D.O.   On: 05/09/2022 16:03   CT Cervical Spine Wo Contrast  Result Date: 05/09/2022 CLINICAL DATA:  Head trauma, abnormal mental status (Age 47-64y); Ataxia, cervical trauma EXAM: CT HEAD WITHOUT CONTRAST CT CERVICAL SPINE WITHOUT CONTRAST TECHNIQUE: Multidetector CT imaging of the head and cervical spine was performed following the standard protocol without intravenous contrast. Multiplanar CT  image reconstructions of the cervical spine were also generated. RADIATION DOSE REDUCTION: This exam was performed according to the departmental dose-optimization program which includes automated exposure control, adjustment of the mA and/or kV according to patient size and/or use of iterative reconstruction technique. COMPARISON:  None Available. FINDINGS: CT HEAD FINDINGS Brain: No evidence of acute infarction, hemorrhage, hydrocephalus, extra-axial collection or mass lesion/mass effect. Vascular: No hyperdense vessel or unexpected calcification. Skull: Normal. Negative for fracture or focal lesion. Sinuses/Orbits: No acute finding. Other: None. CT CERVICAL SPINE FINDINGS Alignment: Facet joints are aligned without dislocation or traumatic listhesis. Dens and lateral masses are aligned. Skull base and vertebrae: No acute fracture. No primary bone lesion or focal pathologic process. Soft tissues and spinal canal: No prevertebral fluid or swelling. No visible canal hematoma. Disc levels:  Within normal limits. Upper chest: Emphysematous changes at the lung apices. Other: None. IMPRESSION: 1. No acute intracranial findings. 2. No acute fracture or subluxation of the cervical spine. 3. Emphysematous changes at the lung apices (ICD10-J43.9). Electronically Signed   By: Duanne Guess D.O.   On: 05/09/2022 16:03   DG Chest Portable 1 View  Result Date: 05/09/2022 CLINICAL DATA:  Right hip fracture. EXAM: PORTABLE CHEST 1 VIEW COMPARISON:  None Available. FINDINGS: The heart size and mediastinal contours are within normal limits. Both lungs are clear. Subacute mildly displaced fracture of the left lateral ninth rib with surrounding callus formation. IMPRESSION: 1. No active disease. 2. Subacute healing fracture of the left lateral ninth rib. Electronically Signed   By: Obie Dredge M.D.   On: 05/09/2022 15:03   DG Hip Unilat W or Wo Pelvis 2-3 Views Right  Result Date: 05/09/2022 CLINICAL DATA:  Fall. EXAM:  DG HIP (WITH OR WITHOUT PELVIS) 2-3V RIGHT COMPARISON:  None Available. FINDINGS: Acute right intertrochanteric femur fracture with 90 degree varus angulation. No dislocation. Joint spaces are preserved. Bone mineralization is normal. Soft tissues are unremarkable. IMPRESSION: 1. Acute right intertrochanteric femur fracture. Electronically Signed   By: Obie Dredge M.D.   On: 05/09/2022 15:01   (Echo, Carotid, EGD, Colonoscopy, ERCP)    Subjective: Pt denies any complaints    Discharge Exam: Vitals:   05/15/22 0031 05/15/22 0801  BP: 105/70 110/66  Pulse: 83 75  Resp: 18 18  Temp: 98.5 F (36.9 C) 98.1 F (36.7 C)  SpO2: 96% 96%   Vitals:   05/14/22 0848 05/14/22 1602 05/15/22 0031 05/15/22 0801  BP: 104/73 112/70 105/70 110/66  Pulse: 82 88 83 75  Resp: 17 17 18 18   Temp: 98.2 F (36.8 C) 98.1 F (36.7 C) 98.5 F (36.9 C) 98.1 F (36.7 C)  TempSrc:      SpO2: 95% 94% 96% 96%    General: Pt is alert, awake, not in acute distress Cardiovascular:  S1/S2 +, no rubs, no gallops Respiratory: CTA bilaterally, no wheezing, no rhonchi Abdominal: Soft, NT, ND, bowel sounds + Extremities: no edema, no cyanosis    The results of significant diagnostics from this hospitalization (including imaging, microbiology, ancillary and laboratory) are listed below for reference.     Microbiology: Recent Results (from the past 240 hour(s))  Surgical PCR screen     Status: Abnormal   Collection Time: 05/09/22  7:00 PM   Specimen: Nasal Mucosa; Nasal Swab  Result Value Ref Range Status   MRSA, PCR NEGATIVE NEGATIVE Final   Staphylococcus aureus POSITIVE (A) NEGATIVE Final    Comment: (NOTE) The Xpert SA Assay (FDA approved for NASAL specimens in patients 25 years of age and older), is one component of a comprehensive surveillance program. It is not intended to diagnose infection nor to guide or monitor treatment. Performed at [facility], [address]  [place], [place] [address]      Labs: BNP (last 3 results) No results for input(s): "BNP" in the last 8760 hours. Basic Metabolic Panel: Recent Labs  Lab 05/11/22 0457 05/12/22 0621 05/13/22 0515 05/14/22 0452 05/15/22 0436  NA 133* 131* 130* 134* 132*  K 4.0 3.7 3.7 3.7 4.0  CL 99 97* 99 100 102  CO2 27 25 24 26 24   GLUCOSE 105* 109* 102* 105* 105*  BUN 8 11 13 12 8   CREATININE 0.82 0.92 0.73 0.79 0.61  CALCIUM 8.1* 8.1* 8.2* 8.2* 8.2*  MG 1.8 1.7 1.9 2.1 1.9   Liver Function Tests: Recent Labs  Lab 05/09/22 1417  AST 41  ALT 18  ALKPHOS 85  BILITOT 1.0  PROT 7.2  ALBUMIN 3.9   No results for input(s): "LIPASE", "AMYLASE" in the last 168 hours. No results for input(s): "AMMONIA" in the last 168 hours. CBC: Recent Labs  Lab 05/09/22 1417 05/10/22 0439 05/11/22 0457 05/12/22 0621 05/13/22 0515 05/14/22 0452 05/15/22 0436  WBC 8.8   < > 15.0* 17.4* 11.2* 11.2* 9.9  NEUTROABS 4.9  --   --   --   --   --   --   HGB 18.2*   < > 13.5 13.8 12.5* 12.0* 11.7*  HCT 53.0*   < > 38.8* 39.9 36.0* 35.1* 34.2*  MCV 108.8*   < > 109.3* 108.4* 106.8* 108.0* 107.5*  PLT 267   < > 213 247 237 283 320   < > = values in this interval not displayed.   Cardiac Enzymes: No results for input(s): "CKTOTAL", "CKMB", "CKMBINDEX", "TROPONINI" in the last 168 hours. BNP: Invalid input(s): "POCBNP" CBG: No results for input(s): "GLUCAP" in the last 168 hours. D-Dimer No results for input(s): "DDIMER" in the last 72 hours. Hgb A1c No results for input(s): "HGBA1C" in the last 72 hours. Lipid Profile No results for input(s): "CHOL", "HDL", "LDLCALC", "TRIG", "CHOLHDL", "LDLDIRECT" in the last 72 hours. Thyroid function studies No results for input(s): "TSH", "T4TOTAL", "T3FREE", "THYROIDAB" in the last 72 hours.  Invalid input(s): "FREET3" Anemia work up Recent Labs    05/14/22 0452  VITAMINB12 119*  FOLATE 11.9   Urinalysis    Component Value Date/Time   COLORURINE YELLOW  (A) 05/09/2022 1900   APPEARANCEUR CLEAR (A) 05/09/2022 1900   LABSPEC 1.016 05/09/2022 1900   PHURINE 5.0 05/09/2022 1900   GLUCOSEU NEGATIVE 05/09/2022 1900   HGBUR NEGATIVE 05/09/2022 1900   BILIRUBINUR NEGATIVE 05/09/2022 1900   KETONESUR NEGATIVE 05/09/2022 1900   PROTEINUR NEGATIVE 05/09/2022 1900  NITRITE NEGATIVE 05/09/2022 1900   LEUKOCYTESUR NEGATIVE 05/09/2022 1900   Sepsis Labs Recent Labs  Lab 05/12/22 0621 05/13/22 0515 05/14/22 0452 05/15/22 0436  WBC 17.4* 11.2* 11.2* 9.9   Microbiology Recent Results (from the past 240 hour(s))  Surgical PCR screen     Status: Abnormal   Collection Time: 05/09/22  7:00 PM   Specimen: Nasal Mucosa; Nasal Swab  Result Value Ref Range Status   MRSA, PCR NEGATIVE NEGATIVE Final   Staphylococcus aureus POSITIVE (A) NEGATIVE Final    Comment: (NOTE) The Xpert SA Assay (FDA approved for NASAL specimens in patients 45 years of age and older), is one component of a comprehensive surveillance program. It is not intended to diagnose infection nor to guide or monitor treatment. Performed at [facility], [address], [place], [place] [address]      Time coordinating discharge: Over 30 minutes  SIGNED:   Charise Killian, MD  [facility] 05/15/2022, 1:41 PM Pager   If 7PM-7AM, please contact night-coverage [email or web address]

## 2022-05-15 NOTE — Progress Notes (Signed)
Physical Therapy Treatment Patient Details Name: Casey Kemp MRN: [id number] DOB: 20-Apr-1975 Today's Date: 05/15/2022   History of Present Illness Pt is a 47 yo M diagnosed with comminuted, displaced, intertrochanteric/base neck fracture of the right hip secondary to a fall and is now s/p IM nail.  PMH includes HTN, HOH, tobacco abuse, and alcohol use.    PT Comments    Pt was long sitting in bed upon arrival. " I'm going to rehab today." Required encouragement but eventually agrees to session. Pt is A and O x 4. Lengthy discussion/education on POC going forward and management of expectations. Pt did get OOB and ambulated ~ 70 ft with RW. Encouraged improved heel strike to toe off however pt endorses increased pain and reverted to ambulate with more of a flat foot gait. Pt is progressing. Does not have assistance available at DC. Acute PT will continue to follow up until DC.     Recommendations for follow up therapy are one component of a multi-disciplinary discharge planning process, led by the attending physician.  Recommendations may be updated based on patient status, additional functional criteria and insurance authorization.     Assistance Recommended at Discharge Frequent or constant Supervision/Assistance  Patient can return home with the following A little help with walking and/or transfers;A little help with bathing/dressing/bathroom;Assist for transportation;Help with stairs or ramp for entrance   Equipment Recommendations  Other (comment)       Precautions / Restrictions Precautions Precautions: Fall Restrictions Weight Bearing Restrictions: Yes RLE Weight Bearing: Partial weight bearing RLE Partial Weight Bearing Percentage or Pounds: 25-50%     Mobility  Bed Mobility Overal bed mobility: Modified Independent     Transfers Overall transfer level: Needs assistance Equipment used: Rolling walker (2 wheels) Transfers: Sit to/from Stand Sit to Stand:  Supervision  General transfer comment: vcs for improved technique and adhering to proper PWBN restrictions    Ambulation/Gait Ambulation/Gait assistance: Supervision Gait Distance (Feet): 70 Feet Assistive device: Rolling walker (2 wheels) Gait Pattern/deviations: Step-to pattern, Decreased stance time - right, Antalgic Gait velocity: decreased  General Gait Details: Pt was able to ambulate ~ 30' with RW with vcs for increased wt bearing on UEs versus LEs. He does well limiting wt off operative LE however struggles to perform heel strike to toe off gait sequencing   Balance Overall balance assessment: Needs assistance Sitting-balance support: Feet supported Sitting balance-Leahy Scale: Normal     Standing balance support: Bilateral upper extremity supported, During functional activity, Reliant on assistive device for balance Standing balance-Leahy Scale: Fair      Cognition Arousal/Alertness: Awake/alert Behavior During Therapy: WFL for tasks assessed/performed Overall Cognitive Status: Within Functional Limits for tasks assessed      General Comments: Pt is A and O x 4.  HOH        Exercises Total Joint Exercises Ankle Circles/Pumps: AROM, Strengthening, Both, 10 reps Quad Sets: Strengthening, Both, 10 reps Gluteal Sets: Strengthening, Both, 10 reps Long Arc Quad: AROM, Strengthening, 5 reps, Both Knee Flexion: AROM, Strengthening, 5 reps, Both    General Comments General comments (skin integrity, edema, etc.): reviewed HEP and importance of maintaining PWB for healing. discussed expectations for post acut PT and answered all of pt's questions about equipment and POC going forward. Pt asked about disability and requested MD note for his work      Pertinent Vitals/Pain Pain Assessment Pain Assessment: 0-10 Pain Score: 5  Pain Location: R hip Pain Descriptors / Indicators: Aching, Sore Pain Intervention(s):  Limited activity within patient's tolerance, Monitored during  session, Premedicated before session, Repositioned     PT Goals (current goals can now be found in the care plan section) Acute Rehab PT Goals Patient Stated Goal: rehab then home Progress towards PT goals: Progressing toward goals    Frequency    BID      PT Plan Current plan remains appropriate       AM-PAC PT "6 Clicks" Mobility   Outcome Measure  Help needed turning from your back to your side while in a flat bed without using bedrails?: None Help needed moving from lying on your back to sitting on the side of a flat bed without using bedrails?: None Help needed moving to and from a bed to a chair (including a wheelchair)?: A Little Help needed standing up from a chair using your arms (e.g., wheelchair or bedside chair)?: A Little Help needed to walk in hospital room?: A Little Help needed climbing 3-5 steps with a railing? : A Lot 6 Click Score: 19    End of Session Equipment Utilized During Treatment: Gait belt Activity Tolerance: Patient tolerated treatment well Patient left: in bed;with call bell/phone within reach;with bed alarm set Nurse Communication: Mobility status;Weight bearing status PT Visit Diagnosis: Unsteadiness on feet (R26.81);Other abnormalities of gait and mobility (R26.89);Muscle weakness (generalized) (M62.81);Pain Pain - Right/Left: Right Pain - part of body: Hip     Time: 1030-1040 PT Time Calculation (min) (ACUTE ONLY): 10 min  Charges:  $Gait Training: 8-22 mins                     Jetta Lout PTA 05/15/22, 11:41 AM

## 2022-05-15 NOTE — TOC Progression Note (Signed)
Transition of Care [facility]) - Progression Note    Patient Details  Name: Casey Kemp MRN: [id number] Date of Birth: 02-05-1975  Transition of Care [facility]) CM/SW Contact  Marlowe Sax, RN Phone Number: 05/15/2022, 1:55 PM  Clinical Narrative:    The patient is going to room 115A at [facility] in [place] [facility] called and arranged transport, he has several ahead of him on EMS list   Expected Discharge Plan: Skilled Nursing Facility Barriers to Discharge: Barriers Resolved  Expected Discharge Plan and Services   Discharge Planning Services: CM Consult   Living arrangements for the past 2 months: Single Family Home Expected Discharge Date: 05/15/22               DME Arranged: N/A DME Agency: NA       HH Arranged: NA           Social Determinants of Health (SDOH) Interventions SDOH Screenings   Food Insecurity: No Food Insecurity (05/09/2022)  Housing: Low Risk  (05/09/2022)  Transportation Needs: No Transportation Needs (05/09/2022)  Utilities: Not At Risk (05/09/2022)  Depression (PHQ2-9): Low Risk  (09/26/2020)  Tobacco Use: High Risk (05/11/2022)    Readmission Risk Interventions     No data to display

## 2022-05-18 ENCOUNTER — Telehealth: Payer: Self-pay

## 2022-05-18 NOTE — Telephone Encounter (Signed)
Called pt and he is following up with Dr Hyacinth Meeker for ortho/ hip fx- will call us after he is able to walk to set up appt to remain established with Dr Yetta Barre

## 2022-06-09 NOTE — Transitions of Care (Post Inpatient/ED Visit) (Signed)
   06/09/2022  Name: Casey Kemp MRN: [id number] DOB: 03/01/1975  Today's TOC FU Call Status:    Attempted to reach the patient regarding the most recent Inpatient/ED visit.  Follow Up Plan: No further outreach attempts will be made at this time. We have been unable to contact the patient. error Signature Karena Addison, LPN [facility] Nurse Health Advisor Direct Dial [phone]

## 2022-06-29 ENCOUNTER — Encounter: Payer: Self-pay | Admitting: Oncology

## 2022-09-29 IMAGING — US US EXTREM LOW VENOUS*R*
1 series · 14 of 24 positions shown · non-contrast
Comparison: None.

CLINICAL DATA: Right lower extremity swelling and pain.

EXAM:
RIGHT LOWER EXTREMITY VENOUS DOPPLER ULTRASOUND
TECHNIQUE: Gray-scale sonography with compression, as well as color and duplex
ultrasound, were performed to evaluate the deep venous system(s)
from the level of the common femoral vein through the popliteal and
proximal calf veins.

[Series 1: us extrem low venous*right* · 0.07mm/px · 14 of 32 slices shown]
[im 1/32]
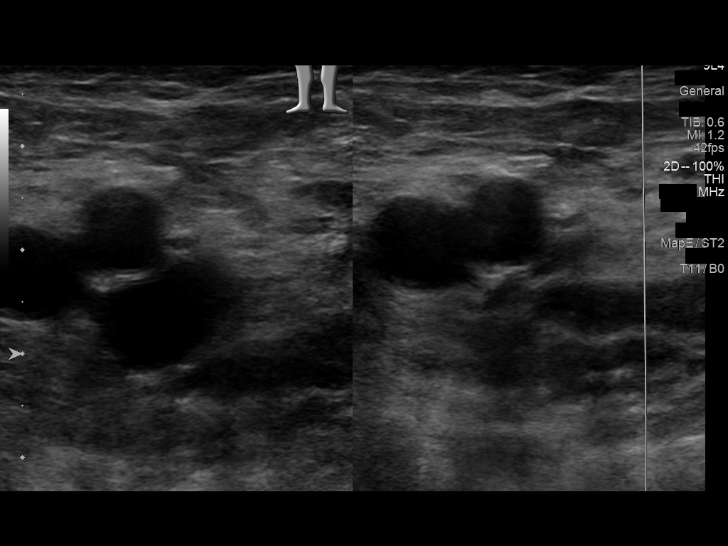
[im 3/32]
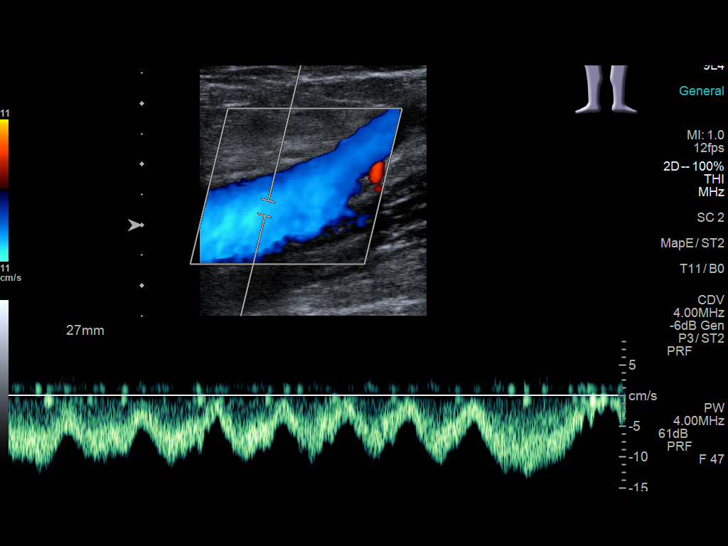
[im 6/32]
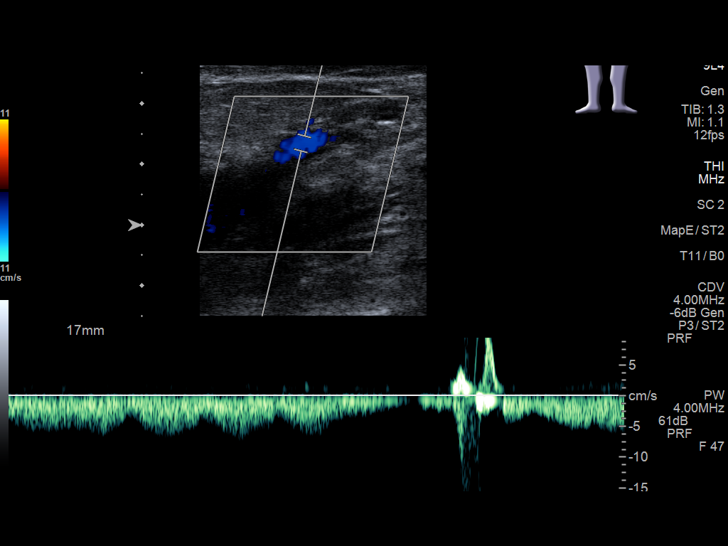
[im 9/32]
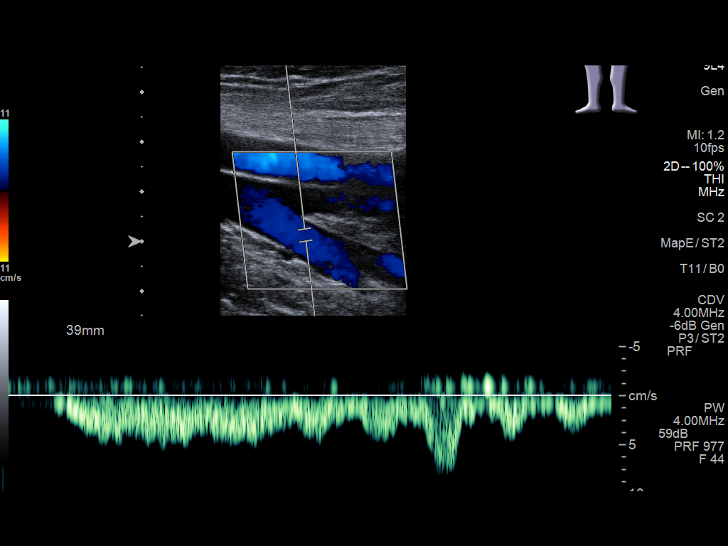
[im 10/32]
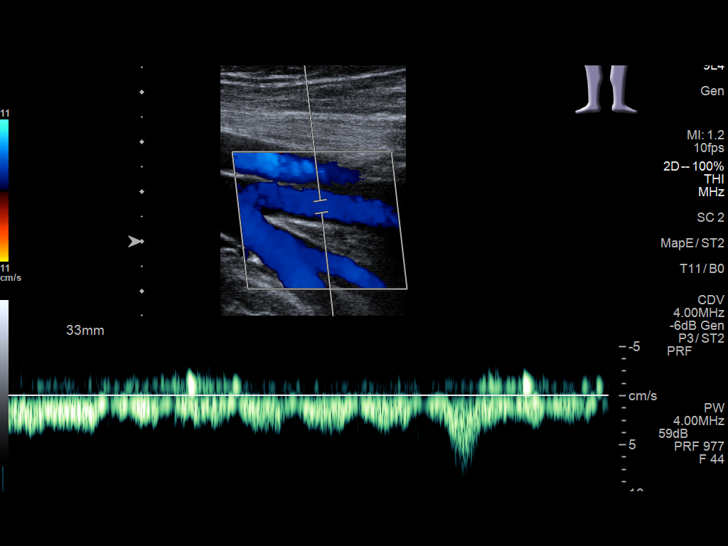
[im 13/32]
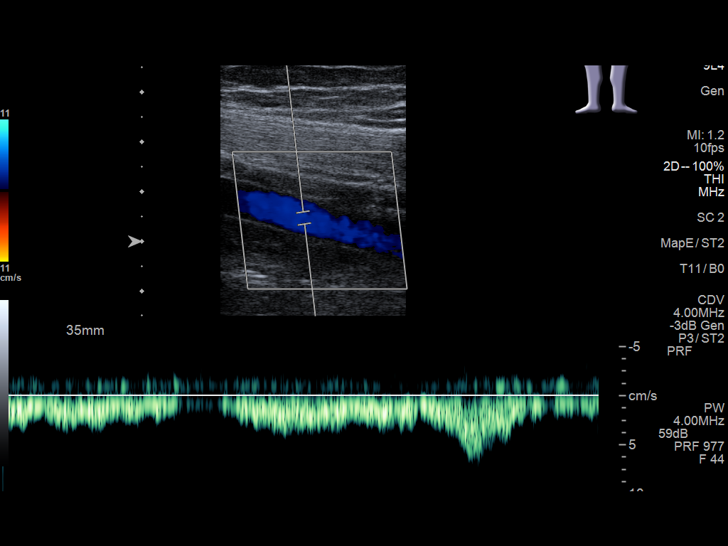
[im 15/32]
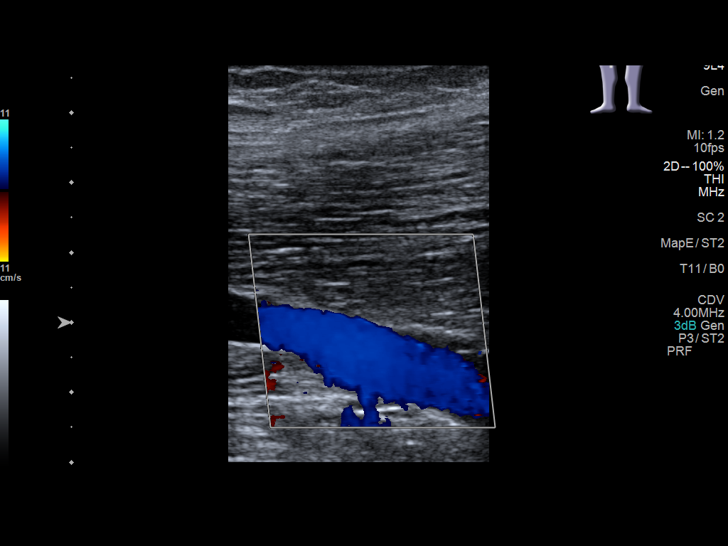
[im 17/32]
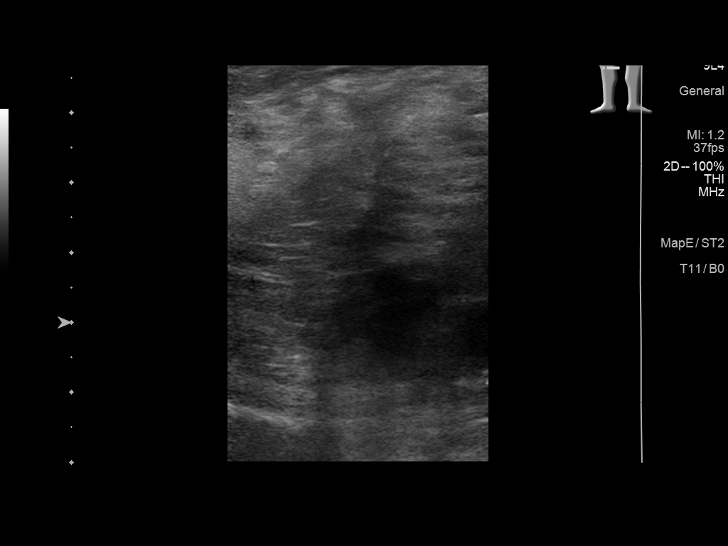
[im 19/32]
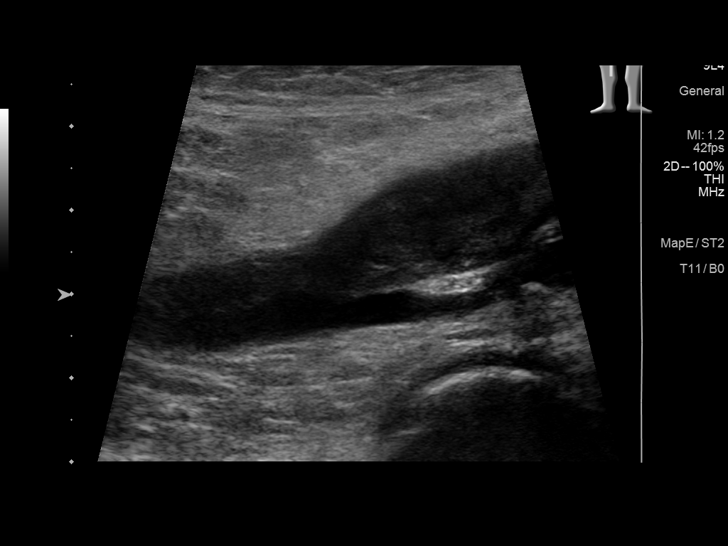
[im 22/32]
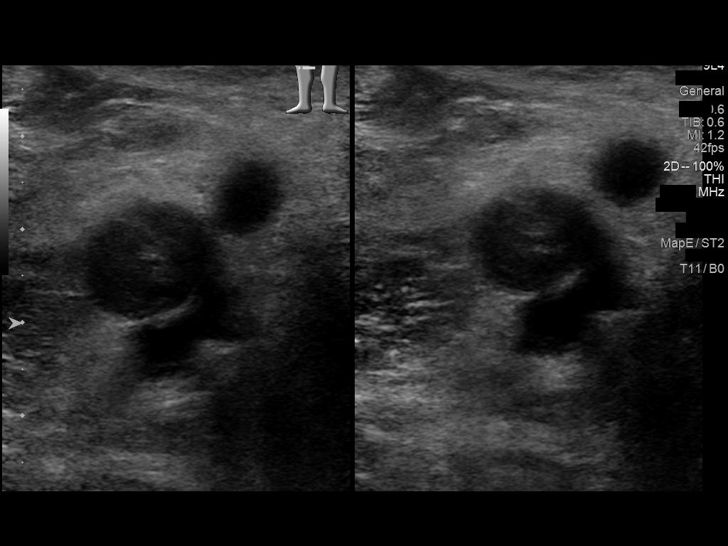
[im 25/32]
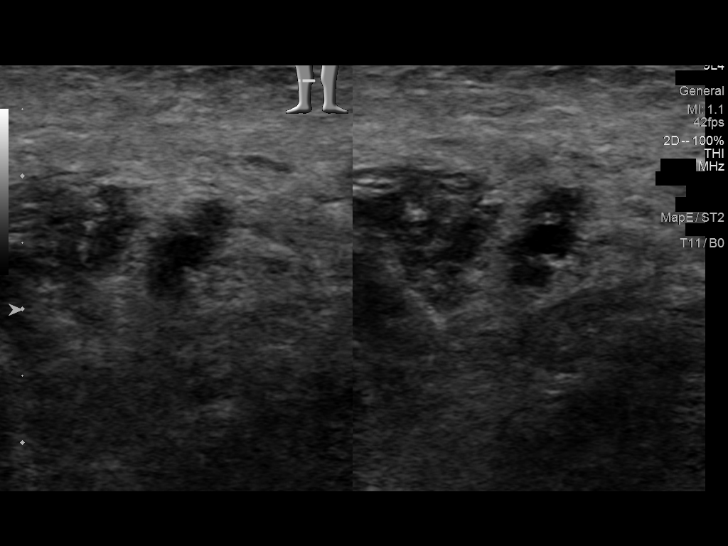
[im 26/32]
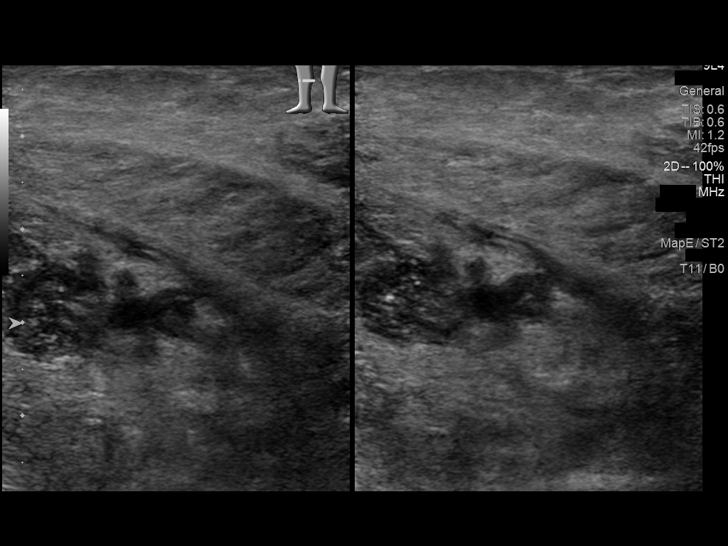
[im 29/32]
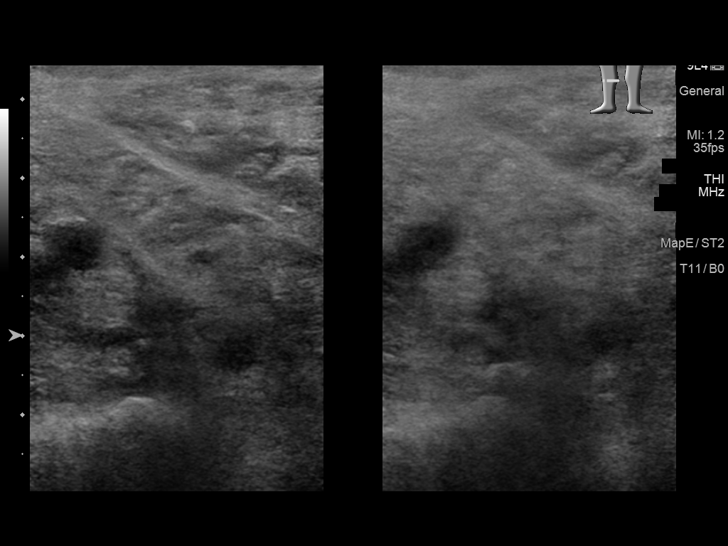
[im 32/32]
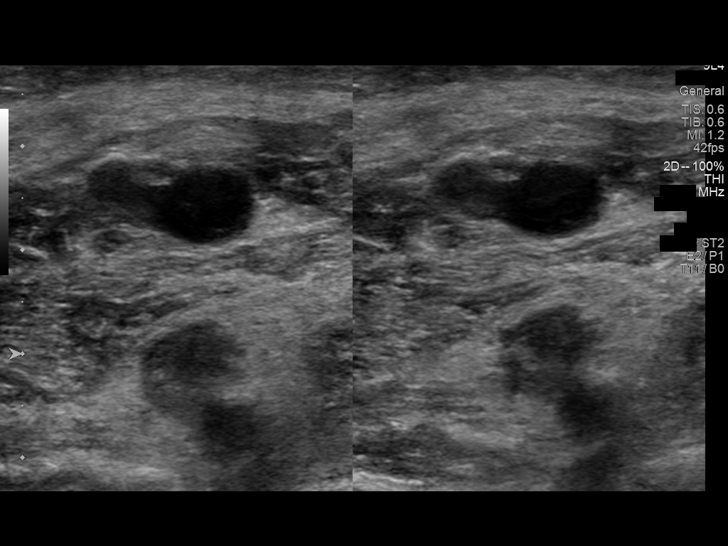

[14 of 24 positions shown; findings below may reference images not displayed]

FINDINGS: VENOUS

Normal compressibility of the common femoral and superficial femoral
veins. Visualized portions of profunda femoral vein and great
saphenous vein unremarkable.

The right popliteal vein is distended and occluded by echogenic
thrombus. Occlusive thrombus is also seen within the peroneal and
posterior tibial veins.

OTHER

None.

Limitations: none
IMPRESSION: Positive for occlusive DVT in the right popliteal vein, peroneal,
and posterior tibial veins.

## 2023-12-19 ENCOUNTER — Other Ambulatory Visit: Payer: Self-pay

## 2023-12-19 ENCOUNTER — Emergency Department

## 2023-12-19 ENCOUNTER — Emergency Department
Admission: EM | Admit: 2023-12-19 | Discharge: 2023-12-19 | Disposition: A | Discharge status: Home / Self Care | Attending: Emergency Medicine | Admitting: Emergency Medicine

## 2023-12-19 ENCOUNTER — Encounter: Payer: Self-pay | Admitting: Oncology

## 2023-12-19 DIAGNOSIS — S43014A Anterior dislocation of right humerus, initial encounter: Secondary | ICD-10-CM | POA: Diagnosis not present

## 2023-12-19 DIAGNOSIS — S4992XA Unspecified injury of left shoulder and upper arm, initial encounter: Secondary | ICD-10-CM | POA: Diagnosis present

## 2023-12-19 DIAGNOSIS — Y908 Blood alcohol level of 240 mg/100 ml or more: Secondary | ICD-10-CM | POA: Insufficient documentation

## 2023-12-19 DIAGNOSIS — W19XXXA Unspecified fall, initial encounter: Secondary | ICD-10-CM

## 2023-12-19 DIAGNOSIS — W010XXA Fall on same level from slipping, tripping and stumbling without subsequent striking against object, initial encounter: Secondary | ICD-10-CM | POA: Diagnosis not present

## 2023-12-19 DIAGNOSIS — Y92019 Unspecified place in single-family (private) house as the place of occurrence of the external cause: Secondary | ICD-10-CM | POA: Insufficient documentation

## 2023-12-19 DIAGNOSIS — S43015A Anterior dislocation of left humerus, initial encounter: Secondary | ICD-10-CM | POA: Insufficient documentation

## 2023-12-19 DIAGNOSIS — S42142A Displaced fracture of glenoid cavity of scapula, left shoulder, initial encounter for closed fracture: Secondary | ICD-10-CM | POA: Insufficient documentation

## 2023-12-19 DIAGNOSIS — S4291XA Fracture of right shoulder girdle, part unspecified, initial encounter for closed fracture: Secondary | ICD-10-CM

## 2023-12-19 DIAGNOSIS — F10929 Alcohol use, unspecified with intoxication, unspecified: Secondary | ICD-10-CM

## 2023-12-19 DIAGNOSIS — F10120 Alcohol abuse with intoxication, uncomplicated: Secondary | ICD-10-CM | POA: Diagnosis not present

## 2023-12-19 LAB — CBC WITH DIFFERENTIAL/PLATELET
Abs Immature Granulocytes: 0.1 K/uL — ABNORMAL HIGH (ref 0.00–0.07)
Basophils Absolute: 0.1 K/uL (ref 0.0–0.1)
Basophils Relative: 1 %
Eosinophils Absolute: 0 K/uL (ref 0.0–0.5)
Eosinophils Relative: 0 %
HCT: 57.9 % — ABNORMAL HIGH (ref 39.0–52.0)
Hemoglobin: 19.6 g/dL — ABNORMAL HIGH (ref 13.0–17.0)
Immature Granulocytes: 1 %
Lymphocytes Relative: 7 %
Lymphs Abs: 1.1 K/uL (ref 0.7–4.0)
MCH: 33.2 pg (ref 26.0–34.0)
MCHC: 33.9 g/dL (ref 30.0–36.0)
MCV: 98 fL (ref 80.0–100.0)
Monocytes Absolute: 0.4 K/uL (ref 0.1–1.0)
Monocytes Relative: 2 %
Neutro Abs: 14.6 K/uL — ABNORMAL HIGH (ref 1.7–7.7)
Neutrophils Relative %: 89 %
Platelets: 315 K/uL (ref 150–400)
RBC: 5.91 MIL/uL — ABNORMAL HIGH (ref 4.22–5.81)
RDW: 12.6 % (ref 11.5–15.5)
WBC: 16.2 K/uL — ABNORMAL HIGH (ref 4.0–10.5)
nRBC: 0 % (ref 0.0–0.2)

## 2023-12-19 LAB — COMPREHENSIVE METABOLIC PANEL WITH GFR
ALT: 10 U/L (ref 0–44)
AST: 22 U/L (ref 15–41)
Albumin: 4.7 g/dL (ref 3.5–5.0)
Alkaline Phosphatase: 77 U/L (ref 38–126)
Anion gap: 17 — ABNORMAL HIGH (ref 5–15)
BUN: 9 mg/dL (ref 6–20)
CO2: 22 mmol/L (ref 22–32)
Calcium: 8.8 mg/dL — ABNORMAL LOW (ref 8.9–10.3)
Chloride: 101 mmol/L (ref 98–111)
Creatinine, Ser: 0.75 mg/dL (ref 0.61–1.24)
GFR, Estimated: >60 mL/min (ref >60)
Glucose, Bld: 133 mg/dL — ABNORMAL HIGH (ref 70–99)
Potassium: 4 mmol/L (ref 3.5–5.1)
Sodium: 140 mmol/L (ref 135–145)
Total Bilirubin: 0.3 mg/dL (ref 0.0–1.2)
Total Protein: 8 g/dL (ref 6.5–8.1)

## 2023-12-19 LAB — ETHANOL: Alcohol, Ethyl (B): 261 mg/dL — ABNORMAL HIGH (ref <15)

## 2023-12-19 MED ORDER — ONDANSETRON HCL 4 MG/2ML IJ SOLN
4.0000 mg | INTRAMUSCULAR | Status: AC
  Administered 2023-12-19: 4 mg via INTRAVENOUS
  Filled 2023-12-19: qty 2

## 2023-12-19 MED ORDER — MORPHINE SULFATE (PF) 4 MG/ML IV SOLN
4.0000 mg | Freq: Once | INTRAVENOUS | Status: AC
  Administered 2023-12-19: 4 mg via INTRAVENOUS
  Filled 2023-12-19: qty 1

## 2023-12-19 MED ORDER — ETOMIDATE 2 MG/ML IV SOLN
15.0000 mg | Freq: Once | INTRAVENOUS | Status: AC
  Administered 2023-12-19: 15 mg via INTRAVENOUS
  Filled 2023-12-19: qty 10

## 2023-12-19 MED ORDER — HYDROCODONE-ACETAMINOPHEN 5-325 MG PO TABS: 2.0000 | ORAL_TABLET | Freq: Four times a day (QID) | ORAL | 0 refills | Status: AC | PRN

## 2023-12-19 NOTE — ED Provider Notes (Addendum)
 [facility] Provider Note    Event Date/Time   First MD Initiated Contact with Patient 12/19/23 [phone]     (approximate)   History   Fall   HPI Casey Kemp is a 48 y.o. male who presents for evaluation after a fall at home.  He has been drinking (he says about a pint of vodka) and he tripped and fell landing on his outstretched right arm.  He has not been able to use it or tolerate having it hang without holding onto it since that time.  No numbness or tingling but difficulty moving it.  No headache, no neck pain.  No chest pain or shortness of breath.  He feels little bit nauseated from the pain but otherwise no abdominal pain and no recent vomiting.  He last drank something about 7 hours ago (he thinks).     Physical Exam   Triage Vital Signs: ED Triage Vitals  Encounter Vitals Group     BP 12/19/23 0341 110/66     Girls Systolic BP Percentile --      Girls Diastolic BP Percentile --      Boys Systolic BP Percentile --      Boys Diastolic BP Percentile --      Pulse Rate 12/19/23 0341 77     Resp 12/19/23 0341 16     Temp 12/19/23 0341 97.7 F (36.5 C)     Temp Source 12/19/23 0341 Oral     SpO2 12/19/23 0341 95 %     Weight --      Height --      Head Circumference --      Peak Flow --      Pain Score 12/19/23 0343 10     Pain Loc --      Pain Education --      Exclude from Growth Chart --     Most recent vital signs: Vitals:   12/19/23 0610 12/19/23 0615  BP: 124/82 134/88  Pulse: 73 75  Resp: 18 18  Temp:    SpO2: 100% 99%    General: Awake, alert, somewhat disheveled, acting appropriate and not clearly intoxicated CV:  Good peripheral perfusion.  Regular rate and rhythm. Resp:  Normal effort. Speaking easily and comfortably, no accessory muscle usage nor intercostal retractions.   Abd:  No distention.  Other:  Obvious deformity of the right shoulder with inability to tolerate any amount of range of motion.  Neurovascularly  intact with easily palpable right radial pulse.   ED Results / Procedures / Treatments   Labs (all labs ordered are listed, but only abnormal results are displayed) Labs Reviewed  CBC WITH DIFFERENTIAL/PLATELET - Abnormal; Notable for the following components:      Result Value   WBC 16.2 (*)    RBC 5.91 (*)    Hemoglobin 19.6 (*)    HCT 57.9 (*)    Neutro Abs 14.6 (*)    Abs Immature Granulocytes 0.10 (*)    All other components within normal limits  COMPREHENSIVE METABOLIC PANEL WITH GFR - Abnormal; Notable for the following components:   Glucose, Bld 133 (*)    Calcium 8.8 (*)    Anion gap 17 (*)    All other components within normal limits  ETHANOL - Abnormal; Notable for the following components:   Alcohol, Ethyl (B) 261 (*)    All other components within normal limits     RADIOLOGY I independently viewed and interpret the  patient's shoulder x-rays and it appears consistent with a right anterior-inferior dislocation.  The radiologist also identified a Hill-Sachs versus Bankart lesion   PROCEDURES:  Critical Care performed: No  .Sedation  Date/Time: 12/19/2023 5:21 AM  Performed by: Gordan Huxley, MD Authorized by: Gordan Huxley, MD   Consent:    Consent obtained:  Verbal, emergent situation and written   Consent given by:  Patient   Risks discussed:  Allergic reaction, dysrhythmia, inadequate sedation, nausea, vomiting, respiratory compromise necessitating ventilatory assistance and intubation, prolonged sedation necessitating reversal and prolonged hypoxia resulting in organ damage   Alternatives discussed:  Analgesia without sedation Universal protocol:    Immediately prior to procedure, a time out was called: yes     Patient identity confirmed:  Arm band and verbally with patient Indications:    Procedure performed:  Dislocation reduction   Procedure necessitating sedation performed by:  Physician performing sedation Pre-sedation assessment:    Time since  last food or drink:  7 hours   NPO status caution: urgency dictates proceeding with non-ideal NPO status     ASA classification: class 2 - patient with mild systemic disease     Mallampati score:  III - soft palate, base of uvula visible   Pre-sedation assessments completed and reviewed: airway patency, cardiovascular function, hydration status, mental status, nausea/vomiting, pain level, respiratory function and temperature   A pre-sedation assessment was completed prior to the start of the procedure Immediate pre-procedure details:    Reassessment: Patient reassessed immediately prior to procedure     Reviewed: vital signs, relevant labs/tests and NPO status     Verified: bag valve mask available, emergency equipment available, intubation equipment available, IV patency confirmed, oxygen available, reversal medications available and suction available   Procedure details (see MAR for exact dosages):    Preoxygenation:  Nasal cannula   Sedation:  Etomidate    Intended level of sedation: deep   Analgesia:  Morphine    Intra-procedure monitoring:  Blood pressure monitoring, cardiac monitor, continuous pulse oximetry, continuous capnometry, frequent LOC assessments and frequent vital sign checks   Total Provider sedation time (minutes):  16 Post-procedure details:   A post-sedation assessment was completed following the completion of the procedure.   Attendance: Constant attendance by certified staff until patient recovered     Recovery: Patient returned to pre-procedure baseline     Post-sedation assessments completed and reviewed: airway patency, cardiovascular function, hydration status, mental status, nausea/vomiting, pain level, respiratory function and temperature     Patient is stable for discharge or admission: yes     Procedure completion:  Tolerated well, no immediate complications .Ortho Injury Treatment  Date/Time: 12/19/2023 5:54 AM  Performed by: Gordan Huxley, MD Authorized by:  Gordan Huxley, MD   Consent:    Consent obtained:  Verbal and written   Consent given by:  Patient   Risks discussed:  Fracture, irreducible dislocation, nerve damage, recurrent dislocation, restricted joint movement, stiffness and vascular damage   Alternatives discussed:  Alternative treatmentInjury location: shoulder Location details: right shoulder Injury type: dislocation Dislocation type: anterior Hill-Sachs deformity: yes Chronicity: new Pre-procedure neurovascular assessment: neurovascularly intact Pre-procedure distal perfusion: normal Pre-procedure neurological function: normal Pre-procedure range of motion: reduced  Anesthesia: Local anesthesia used: no  Patient sedated: Yes. Refer to sedation procedure documentation for details of sedation. Manipulation performed: yes Reduction method: external rotation Reduction successful: yes X-ray confirmed reduction: yes Immobilization: sling Post-procedure neurovascular assessment: post-procedure neurovascularly intact Post-procedure distal perfusion: normal Post-procedure neurological function: normal Post-procedure range of  motion: improved       IMPRESSION / MDM / ASSESSMENT AND PLAN / ED COURSE  I reviewed the triage vital signs and the nursing notes.                              Differential diagnosis includes, but is not limited to, fracture, dislocation  Patient's presentation is most consistent with acute presentation with potential threat to life or bodily function.  Labs/studies ordered: CT head, right shoulder x-rays, CT C-spine, CBC with differential, ethanol level, CMP  Interventions/Medications given:  Medications  etomidate  (AMIDATE ) injection 15 mg (15 mg Intravenous Given 12/19/23 0554)  morphine  (PF) 4 MG/ML injection 4 mg (4 mg Intravenous Given 12/19/23 0536)  ondansetron  (ZOFRAN ) injection 4 mg (4 mg Intravenous Given 12/19/23 0536)    (Note:  hospital course my include additional  interventions and/or labs/studies not listed above.)   Obvious anterior/inferior shoulder dislocation both clinically and on imaging.  NPO/intoxication status is less than ideal but need to proceed given the acute injury.  I discussed risks and benefits with him.  I am ordering 1 L LR; he apparently gets regular phlebotomy and this appears to be a chronic issue but I will also give LR to support his blood pressure during sedation.  Given his alcohol dependence I feel that etomidate  will be a better choice than propofol  I will since he will likely be somewhat resistant to the effects of the propofol .  I am also giving a dose of morphine  4 mg IV and Zofran  4 mg IV prior to the procedure.  The patient is on the cardiac monitor to evaluate for evidence of arrhythmia and/or significant heart rate changes.   Clinical Course as of 12/19/23 9282  [place] Dec 19, 2023  0712 DG Shoulder Right Portable I independently viewed and interpreted the patient's postreduction shoulder x-rays.  The shoulder has been successfully reduced.  Confirmed by radiology.  Hill-Sachs deformity still present. [CF]  [id number] Transferring ED care to Dr. Jossie to follow-up on head CT which is still pending by radiology.  However, I independently viewed and interpreted the head CT and I see no evidence of fracture or nor intracranial hemorrhage. [CF]  [phone] Patient's official CT report came back.  They confirm no acute intracranial injury.  I reassessed the patient and he is awake and alert, feels much better.  I gave him my usual and customary post dislocation and postreduction recommendations and follow-up plan and he agrees. [CF]    Clinical Course User Index [CF] Gordan Huxley, MD     FINAL CLINICAL IMPRESSION(S) / ED DIAGNOSES   Final diagnoses:  Fall, initial encounter  Traumatic closed displaced fracture of right shoulder with anterior dislocation, initial encounter  Alcoholic intoxication with complication     Rx / DC  Orders   ED Discharge Orders          Ordered    HYDROcodone -acetaminophen  (NORCO/VICODIN) 5-325 MG tablet  Every 6 hours PRN        12/19/23 0649             Note:  This document was prepared using Dragon voice recognition software and may include unintentional dictation errors.   Gordan Huxley, MD 12/19/23 9285    Gordan Huxley, MD 12/19/23 [phone]

## 2023-12-19 NOTE — ED Notes (Signed)
 Patient sitting up in wheelchair drinking a soda. Discharge instructions reviewed and denies any questions. Cab called and provided a cab voucher.

## 2023-12-19 NOTE — Sedation Documentation (Signed)
 Shoulder reduced by EDP Gordan at this time.

## 2023-12-19 NOTE — ED Notes (Signed)
 Unable to obtain any imaging as pt will not cooperate with radiology staff

## 2023-12-19 NOTE — ED Triage Notes (Signed)
 Pt presents for unwitnessed fall. ETOH. Endorsing right shoulder pain. PMS intact. Pt states he fell, was unable to get up r/t intoxication and was on the ground approx 3 hours prior to EMS. No visible injuries to head. Endorsing nausea

## 2023-12-19 NOTE — Discharge Instructions (Addendum)
You have been seen in the Emergency Department (ED) today for a shoulder dislocation.  It was put back in place in the ED.  Please follow up as written with the recommend orthopedic surgeon.  It is important you leave the shoulder immobilizer in place until they tell you to remove it.  Take Norco as prescribed for severe pain. Do not drink alcohol, drive or participate in any other potentially dangerous activities while taking this medication as it may make you sleepy. Do not take this medication with any other sedating medications, either prescription or over-the-counter. If you were prescribed Percocet or Vicodin, do not take these with acetaminophen (Tylenol) as it is already contained within these medications.   This medication is an opiate (or narcotic) pain medication and can be habit forming.  Use it as little as possible to achieve adequate pain control.  Do not use or use it with extreme caution if you have a history of opiate abuse or dependence.  If you are on a pain contract with your primary care doctor or a pain specialist, be sure to let them know you were prescribed this medication today from the [facility] Emergency Department.  This medication is intended for your use only - do not give any to anyone else and keep it in a secure place where nobody else, especially children, have access to it.  It will also cause or worsen constipation, so you may want to consider taking an over-the-counter stool softener while you are taking this medication.  If you have worsening pain or swelling, if the shoulder dislocates again, or if you have any other symptoms that concern you, please return immediately to the Emergency Department.

## 2023-12-19 NOTE — ED Notes (Signed)
 Patient transported to CT
